# Patient Record
Sex: Female | Born: 1975 | Race: White | Hispanic: No | Marital: Married | Smoking: Never smoker
Health system: Southern US, Community
[De-identification: ages and names within clinical notes are randomized; demographics above are authoritative.]

## PROBLEM LIST (undated history)

## (undated) ENCOUNTER — Emergency Department (HOSPITAL_BASED_OUTPATIENT_CLINIC_OR_DEPARTMENT_OTHER): Payer: BC Managed Care – PPO | Source: Home / Self Care

## (undated) DIAGNOSIS — Z8759 Personal history of other complications of pregnancy, childbirth and the puerperium: Secondary | ICD-10-CM

## (undated) DIAGNOSIS — Z8659 Personal history of other mental and behavioral disorders: Secondary | ICD-10-CM

## (undated) DIAGNOSIS — D539 Nutritional anemia, unspecified: Secondary | ICD-10-CM

## (undated) DIAGNOSIS — R569 Unspecified convulsions: Secondary | ICD-10-CM

## (undated) DIAGNOSIS — G43909 Migraine, unspecified, not intractable, without status migrainosus: Secondary | ICD-10-CM

## (undated) DIAGNOSIS — E669 Obesity, unspecified: Secondary | ICD-10-CM

## (undated) HISTORY — DX: Unspecified convulsions: R56.9

## (undated) HISTORY — DX: Obesity, unspecified: E66.9

## (undated) HISTORY — DX: Personal history of other complications of pregnancy, childbirth and the puerperium: Z87.59

## (undated) HISTORY — DX: Nutritional anemia, unspecified: D53.9

## (undated) HISTORY — DX: Migraine, unspecified, not intractable, without status migrainosus: G43.909

## (undated) HISTORY — DX: Personal history of other mental and behavioral disorders: Z86.59

## (undated) HISTORY — PX: WISDOM TOOTH EXTRACTION: SHX21

---

## 2002-12-23 ENCOUNTER — Other Ambulatory Visit: Admission: RE | Admit: 2002-12-23 | Discharge: 2002-12-23 | Payer: Self-pay | Admitting: Obstetrics and Gynecology

## 2009-10-16 ENCOUNTER — Ambulatory Visit: Payer: Self-pay | Admitting: Family Medicine

## 2009-10-16 DIAGNOSIS — R635 Abnormal weight gain: Secondary | ICD-10-CM

## 2009-10-17 ENCOUNTER — Encounter: Payer: Self-pay | Admitting: Family Medicine

## 2009-10-18 LAB — CONVERTED CEMR LAB
Albumin: 3.7 g/dL (ref 3.5–5.2)
BUN: 12 mg/dL (ref 6–23)
Calcium: 8.8 mg/dL (ref 8.4–10.5)
Chloride: 104 meq/L (ref 96–112)
Glucose, Bld: 83 mg/dL (ref 70–99)
HDL: 44 mg/dL (ref >39)
Potassium: 4.4 meq/L (ref 3.5–5.3)
Sodium: 137 meq/L (ref 135–145)
TSH: 1.783 microintl units/mL (ref 0.350–4.500)
Total Protein: 7 g/dL (ref 6.0–8.3)
Triglycerides: 87 mg/dL (ref <150)

## 2009-11-14 ENCOUNTER — Encounter: Payer: Self-pay | Admitting: Family Medicine

## 2009-11-21 ENCOUNTER — Ambulatory Visit: Payer: Self-pay | Admitting: Family Medicine

## 2009-11-21 DIAGNOSIS — IMO0001 Reserved for inherently not codable concepts without codable children: Secondary | ICD-10-CM | POA: Insufficient documentation

## 2009-11-21 DIAGNOSIS — E669 Obesity, unspecified: Secondary | ICD-10-CM

## 2009-11-21 DIAGNOSIS — R030 Elevated blood-pressure reading, without diagnosis of hypertension: Secondary | ICD-10-CM

## 2009-12-21 ENCOUNTER — Ambulatory Visit: Payer: Self-pay | Admitting: Family Medicine

## 2010-05-01 NOTE — Assessment & Plan Note (Signed)
Summary: WT check   Vital Signs:  Patient profile:   35 year old female Menstrual status:  regular Height:      66.25 inches Weight:      214 pounds BMI:     34.40 O2 Sat:      99 % on Room air Pulse rate:   74 / minute BP sitting:   143 / 79  (left arm) Cuff size:   large  Vitals Entered By: Payton Spark CMA (November 21, 2009 9:12 AM)  O2 Flow:  Room air CC: F/U weight.    Primary Care Ray Glacken:  Seymour Bars DO  CC:  F/U weight. .  History of Present Illness: 35 yo WF presents for f/u weight managment.  She is exercising 3-4 days/ wk.  She is walking on the treadmill for 45 min each time.  She is walking fast and feels limited by her large breasts.  She is interested in possibly having breast reduction surgery.  She would like to lose more weight before proceeding with this.  She has cut out all sweet tea.  She eating out less often and doing a food diary.  She is feeling better since she is eating better.  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    Current Medications (verified): 1)  Seasonale 0.15-0.03 Mg Tabs (Levonorgest-Eth Estrad 91-Day) .... Take 1 Tab By Mouth Once Daily  Allergies (verified): No Known Drug Allergies  Past History:  Past Medical History: Reviewed history from 10/16/2009 and no changes required. G2P2 obesity  [facility]  Family History: Reviewed history from 10/16/2009 and no changes required. mother and father healthy 2 sisters healthy  Social History: Reviewed history from 10/16/2009 and no changes required. 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 Has 2 sons. Never smoked. Denies ETOH. No exercise, fair diet.  Review of Systems      See HPI  Physical Exam  General:  alert, well-developed, well-nourished, and well-hydrated.  obese Psych:  good eye contact, not anxious appearing, and not depressed appearing.     Impression & Recommendations:  Problem # 1:  ELEVATED BLOOD PRESSURE  WITHOUT DIAGNOSIS OF HYPERTENSION (ICD-796.2) BP checked x 2 (>140/90) but this is the first time it has been high. Recheck at nurse visit in 1 month.  Problem # 2:  OBESITY, UNSPECIFIED (ICD-278.00) BMI 34 c/w class I obesity (down from class II). Doing well.  Lost 11 lbs in  month with just diet/ exercise.  Reviewed her plan for continued wt loss thru lifestyle changes and her recent labs (normal fasting glucose, cholesterol, thyroid function with slight ALT elevation -- likely to be fatty liver).  Will RTC in 1 month to check back in for nurse visit WT/ BP check.  Complete Medication List: 1)  Seasonale 0.15-0.03 Mg Tabs (Levonorgest-eth estrad 91-day) .... Take 1 tab by mouth once daily  Patient Instructions: 1)  Repeat BP: 2)  Weight  Down 225--> 214  GREAT! 3)  Keep up the good work with food diary and avoiding sugar sweetenend drinks. 4)  Continue exercise: goal is 45-60 min 4-5 days/ wk. 5)  Return for  nurse visit WT/ BP check in 1 month.

## 2010-05-01 NOTE — Letter (Signed)
Summary: [facility]  [facility]   Imported By: Lanelle Bal 11/21/2009 12:55:50  _____________________________________________________________________  External Attachment:    Type:   Image     Comment:   External Document

## 2010-05-01 NOTE — Assessment & Plan Note (Signed)
Summary: nurse visit - BP/ WT check  Nurse Visit   Vital Signs:  Patient profile:   35 year old female Menstrual status:  regular Height:      66.25 inches Weight:      210 pounds Pulse rate:   76 / minute BP sitting:   118 / 69  (left arm) Cuff size:   large  Vitals Entered By: Payton Spark CMA (December 21, 2009 8:38 AM) CC: F/U weight and BP   Allergies: No Known Drug Allergies  Orders Added: 1)  Est. Patient Level I [16109]

## 2010-05-01 NOTE — Assessment & Plan Note (Signed)
Summary: NOV Weight gain   Vital Signs:  Patient profile:   35 year old female Menstrual status:  regular Height:      66.25 inches Weight:      225 pounds BMI:     36.17 O2 Sat:      98 % on Room air Pulse rate:   77 / minute BP sitting:   130 / 77  (left arm) Cuff size:   large  Vitals Entered By: Payton Spark CMA (October 16, 2009 10:26 AM)  O2 Flow:  Room air CC: New to est.      Menstrual Status regular   Primary Care Provider:  Seymour Bars DO  CC:  New to est. .  History of Present Illness: 35 yo WF presents to est care.  She sees [facility] for gyn care.  She is G2P2002.  She is otherwise healthy besides obesity.  She would like to lose weight.  She failed to lose her pregnancy wt after the 2nd child 3 yrs ago.  She is not exercising and admits to frequent intake of fast food due to time constraints.    She is due for fasting labs.  Current Medications (verified): 1)  Seasonale 0.15-0.03 Mg Tabs (Levonorgest-Eth Estrad 91-Day) .... 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 Never smoked. Denies ETOH. No exercise, fair diet.  Review of Systems       no fevers/sweats/weakness, unexplained wt loss/gain, no change in vision, no difficulty hearing, ringing in ears, no hay fever/+allergies, no CP/discomfort, no palpitations, no breast lump/nipple discharge, no cough/wheeze, no blood in stool, no N/V/D, no nocturia, no leaking urine, no unusual vag bleeding, no vaginal/penile discharge, no muscle/joint pain, no rash, no new/changing mole, no HA, no memory loss, no anxiety, no sleep problem, no depression, no unexplained lumps, no easy bruising/bleeding, no  concern with sexual function   Physical Exam  General:  obese WF in NAD Head:  normocephalic and atraumatic.   Mouth:  good dentition and pharynx pink and moist.   Neck:  no masses.   Lungs:  Normal respiratory effort, chest expands symmetrically. Lungs are clear to auscultation, no crackles or wheezes. Heart:  Normal rate and regular rhythm. S1 and S2 normal without gallop, murmur, click, rub or other extra sounds. Extremities:  no LE edema Skin:  color normal.   Psych:  good eye contact, not anxious appearing, and not depressed appearing.     Impression & Recommendations:  Problem # 1:  WEIGHT GAIN (ICD-783.1) Weight gain ever since the birth of her kids, starting 7 yrs ago with a goal wt of 150.  Will check TSH wit5h fasting labs and look for co morbidities such as dyslipidemia, IFG, fatty liver dz.  Counseled on the importance of weight loss thru healthy diet and regular exercise.  Wrote out some parameters for her to follow for the next month, then f/u with me in 1 month. Orders: T-TSH ([id number])  Complete Medication List: 1)  Seasonale 0.15-0.03 Mg Tabs (Levonorgest-eth estrad 91-day) .... Take 1 tab by mouth once daily  Other Orders: T-Comprehensive Metabolic Panel [phone]) T-Lipid Profile ([id number])  Patient Instructions: 1)  Work on healthy diet: 2)  Avoid  fast food.  Eat a high fiber breakfast each morning.  Try to eat more fruits and veggies, lean sources of protein and avoid sweet tea. 3)  Aim for getting 45+ min of walking in 5 days/ wk. 4)  Update fasting labs. 5)  Return for f/u Weight in 1 month to see how you are doing.

## 2010-11-14 ENCOUNTER — Inpatient Hospital Stay (INDEPENDENT_AMBULATORY_CARE_PROVIDER_SITE_OTHER)
Admission: RE | Admit: 2010-11-14 | Discharge: 2010-11-14 | Disposition: A | Discharge status: Home / Self Care | Payer: BC Managed Care – PPO | Source: Ambulatory Visit | Attending: Emergency Medicine | Admitting: Emergency Medicine

## 2010-11-14 ENCOUNTER — Encounter: Payer: Self-pay | Admitting: Emergency Medicine

## 2010-11-14 DIAGNOSIS — J209 Acute bronchitis, unspecified: Secondary | ICD-10-CM

## 2010-11-14 LAB — CONVERTED CEMR LAB: Rapid Strep: NEGATIVE

## 2010-12-27 ENCOUNTER — Encounter: Payer: Self-pay | Admitting: Emergency Medicine

## 2010-12-27 ENCOUNTER — Inpatient Hospital Stay (INDEPENDENT_AMBULATORY_CARE_PROVIDER_SITE_OTHER)
Admission: RE | Admit: 2010-12-27 | Discharge: 2010-12-27 | Disposition: A | Discharge status: Home / Self Care | Payer: BC Managed Care – PPO | Source: Ambulatory Visit | Attending: Emergency Medicine | Admitting: Emergency Medicine

## 2010-12-27 DIAGNOSIS — L2089 Other atopic dermatitis: Secondary | ICD-10-CM

## 2010-12-27 LAB — CONVERTED CEMR LAB: Rapid Strep: NEGATIVE

## 2010-12-29 ENCOUNTER — Inpatient Hospital Stay (INDEPENDENT_AMBULATORY_CARE_PROVIDER_SITE_OTHER)
Admission: RE | Admit: 2010-12-29 | Discharge: 2010-12-29 | Disposition: A | Discharge status: Home / Self Care | Payer: BC Managed Care – PPO | Source: Ambulatory Visit | Attending: Family Medicine | Admitting: Family Medicine

## 2010-12-29 ENCOUNTER — Encounter: Payer: Self-pay | Admitting: Family Medicine

## 2010-12-29 DIAGNOSIS — R21 Rash and other nonspecific skin eruption: Secondary | ICD-10-CM

## 2010-12-29 DIAGNOSIS — L2089 Other atopic dermatitis: Secondary | ICD-10-CM

## 2011-03-04 NOTE — Progress Notes (Signed)
Summary: SWOLLEN LYMPH NODES UNDER BOTH ARMS (room 2)   Vital Signs:  Patient Profile:   35 Years Old Female CC:      F/U from 9-27; lymph nodes bilat axilla swollen Height:     66.25 inches O2 Sat:      98 % O2 treatment:    Room Air Temp:     98.8 degrees F oral Pulse rate:   93 / minute Resp:     16 per minute BP sitting:   132 / 88  (left arm) Cuff size:   regular  Vitals Entered By: Lavell Islam RN (December 29, 2010 12:53 PM)                  Prior Medication List:  SEASONALE 0.15-0.03 MG TABS (LEVONORGEST-ETH ESTRAD 91-DAY) Take 1 tab by mouth once daily   Current Allergies (reviewed today): No known allergies History of Present Illness Chief Complaint: F/U from 9-27; lymph nodes bilat axilla swollen History of Present Illness:  Subjective:  Patient returns for follow-up of previously described rash.  She reports that lesions are still present on her upper chest, and resolving on her back.  She has developed increased tenderness in axillary nodes on both sides, worse on the right.  She feels well however.  No fevers, chills, and sweats.  Over the past week she has had some mild sinus congestion, but no significant sore throat.  No cough.  No GI or GU symptoms.  No tick bites.  She does remember having some mosquito bites recently.  Past history of mono.  She denies sores in mouth.  REVIEW OF SYSTEMS Constitutional Symptoms      Denies fever, chills, night sweats, weight loss, weight gain, and fatigue.  Eyes       Denies change in vision, eye pain, eye discharge, glasses, contact lenses, and eye surgery. Ear/Nose/Throat/Mouth       Denies hearing loss/aids, change in hearing, ear pain, ear discharge, dizziness, frequent runny nose, frequent nose bleeds, sinus problems, sore throat, hoarseness, and tooth pain or bleeding.  Respiratory       Denies dry cough, productive cough, wheezing, shortness of breath, asthma, bronchitis, and emphysema/COPD.  Cardiovascular      Denies murmurs, chest pain, and tires easily with exhertion.    Gastrointestinal       Denies stomach pain, nausea/vomiting, diarrhea, constipation, blood in bowel movements, and indigestion. Genitourniary       Denies painful urination, kidney stones, and loss of urinary control. Neurological       Denies paralysis, seizures, and fainting/blackouts. Musculoskeletal       Denies muscle pain, joint pain, joint stiffness, decreased range of motion, redness, swelling, muscle weakness, and gout.  Skin       Complains of unusual moles/lumps or sores.      Denies bruising and hair/skin or nail changes.  Psych       Denies mood changes, temper/anger issues, anxiety/stress, speech problems, depression, and sleep problems. Other Comments: bilateral axilla lymph nodes swollen/red   Past History:  Past Medical History: Reviewed history from 10/16/2009 and no changes required. G2P2 obesity  [facility]  Past Surgical History: Reviewed history from 11/14/2010 and no changes required. LTCS x 2 Caesarean section  Family History: Reviewed history from 10/16/2009 and no changes required. mother and father healthy 2 sisters healthy  Social History: Reviewed history from 10/16/2009 and no changes required. 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 Never smoked. Denies ETOH. No exercise, fair diet.   Objective:  Appearance:  Patient appears healthy, stated age, and in no acute distress  Eyes:  Pupils are equal, round, and reactive to light and accomodation.  Extraocular movement is intact.  Conjunctivae are not inflamed.  Ears:  Canals normal.  Tympanic membranes normal.   Nose:  Mildly congested turbinates.  No sinus tenderness  Pharynx:  Normal  Neck:  Supple.  No adenopathy is present.  No thyromegaly is present  Lungs:  Clear to auscultation.  Breath sounds are equal.  Heart:  Regular rate and rhythm without murmurs, rubs, or  gallops.  Abdomen:  Nontender without masses or hepatosplenomegaly.  Bowel sounds are present.  No CVA or flank tenderness.  Axillary areas:  Right reveals several shotty tender nodes.  Left reveals only 2 or 3 shotty nodes Skin:  On upper chest are numerous erythematous non-specific macules about 5 to 8mm dia.  Upper back reveals similar lesions in a resolving stage. CBC:  WBC 7.7 ; LY 22.1, MO 4.5, GR 73,4; Hgb 14.3  Assessment New Problems: EXANTHEM (ICD-782.1) DERMATITIS, ATOPIC (ICD-691.8)  SUSPECT VIRAL EXANTHEM, RESOLVING.  WITH AXILLARY ADENOPATHY, POSSIBLE WEST NILE VIRUS  Plan New Orders: CBC w/Diff [[id number]] Est. Patient Level IV [52841] Services provided After hours-Weekends-Holidays [99051] Planning Comments:   Chart reviewed (notes previous visit).  Since CBC is normal and patient is improving, recommend observation only at this time.  If symptoms persist or increase, recommend follow-up with dermatologist.   The patient and/or caregiver has been counseled thoroughly with regard to medications prescribed including dosage, schedule, interactions, rationale for use, and possible side effects and they verbalize understanding.  Diagnoses and expected course of recovery discussed and will return if not improved as expected or if the condition worsens. Patient and/or caregiver verbalized understanding.   Orders Added: 1)  CBC w/Diff [[id number]] 2)  Est. Patient Level IV [53664] 3)  Services provided After hours-Weekends-Holidays [99051]

## 2011-03-04 NOTE — Progress Notes (Signed)
Summary: extreme congestion/sore throat/thight chest (rm 2)   Vital Signs:  Patient Profile:   35 Years Old Female CC:      sore throat, congestion, cough, ear pain x 2 days Height:     66.25 inches Weight:      196 pounds O2 Sat:      98 % O2 treatment:    Room Air Temp:     99.5 degrees F oral Pulse rate:   86 / minute Resp:     18 per minute BP sitting:   118 / 82  (left arm) Cuff size:   large  Vitals Entered By: Lajean Saver RN (November 14, 2010 3:27 PM)                  Updated Prior Medication List: SEASONALE 0.15-0.03 MG TABS (LEVONORGEST-ETH ESTRAD 91-DAY) Take 1 tab by mouth once daily  Current Allergies: No known allergies History of Present Illness Chief Complaint: sore throat, congestion, cough, ear pain x 2 days History of Present Illness: Pt complains of 2 days of  sinus and chest congestion.  Mild  sore throat. + productive cough, No dyspnea. No chest pain. + rare wheezing.  No nausea No vomiting. + low grade fever, No chills No hx asthma or chronic lung dz   REVIEW OF SYSTEMS Constitutional Symptoms      Denies fever, chills, night sweats, weight loss, weight gain, and fatigue.  Eyes       Denies change in vision, eye pain, eye discharge, glasses, contact lenses, and eye surgery. Ear/Nose/Throat/Mouth       Complains of ear pain, frequent runny nose, sinus problems, and sore throat.      Denies hearing loss/aids, change in hearing, ear discharge, dizziness, frequent nose bleeds, hoarseness, and tooth pain or bleeding.  Respiratory       Complains of productive cough.      Denies dry cough, wheezing, shortness of breath, asthma, bronchitis, and emphysema/COPD.  Cardiovascular       Denies murmurs, chest pain, and tires easily with exhertion.    Gastrointestinal       Denies stomach pain, nausea/vomiting, diarrhea, constipation, blood in bowel movements, and indigestion. Genitourniary       Denies painful urination, blood or discharge from  vagina, kidney stones, and loss of urinary control. Neurological       Complains of headaches.      Denies paralysis, seizures, and fainting/blackouts. Musculoskeletal       Denies muscle pain, joint pain, joint stiffness, decreased range of motion, redness, swelling, muscle weakness, and gout.  Skin       Denies bruising, unusual mles/lumps or sores, and hair/skin or nail changes.  Psych       Denies mood changes, temper/anger issues, anxiety/stress, speech problems, depression, and sleep problems.  Past History:  Past Medical History: Reviewed history from 10/16/2009 and no changes required. G2P2 obesity  [facility]  Past Surgical History: LTCS x 2 Caesarean section  Family History: Reviewed history from 10/16/2009 and no changes required. mother and father healthy 2 sisters healthy  Social History: Reviewed history from 10/16/2009 and no changes required. 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 Never smoked. Denies ETOH. No exercise, fair diet. Physical Exam General appearance: well developed, well nourished, no acute distress Head: normocephalic, atraumatic Eyes: conjunctivae and lids normal Pupils: equal, round, reactive to light Ears: normal, no lesions or deformities Nasal: swollen red turbinates with congestion Oral/Pharynx:  mild pharyngeal erythema without exudate, uvula midline without deviation Neck: neck supple,  trachea midline, no masses Chest/Lungs: no rales bilateral, breath sounds equal without effort. Mild anterior rhonchi. Rare late exp wheeze heard once, cleared after coughing. Heart: regular rate and  rhythm, no murmur Extremities: normal extremities Neurological: grossly intact and non-focal Skin: no obvious rashes or lesions MSE: oriented to time, place, and person Assessment New Problems: ACUTE BRONCHITIS (ICD-466.0)   Patient Education: Patient and/or caregiver instructed in the  following: rest fluids and Tylenol.  Plan New Medications/Changes: PROMETHAZINE-CODEINE 6.25-10 MG/5ML SYRP (PROMETHAZINE-CODEINE) 1 or 2 teaspoons every 4-6 hrs as needed cough (may cause drowsiness)  #120 ml x 0, 11/14/2010, Lajean Manes MD ZITHROMAX Z-PAK 250 MG TABS (AZITHROMYCIN) take as directed  #1 pak x 0, 11/14/2010, Lajean Manes MD  New Orders: New Patient Level II [id number] Planning Comments:   We both agree that bronchodilator and/or prednisone not indicated at this point, but return here or pcp if worsening or not improving within 2-3 days .Risks, benefits, alternatives discussed. Pt voiced understanding and agreement.  Follow Up: Follow up with Primary Physician  The patient and/or caregiver has been counseled thoroughly with regard to medications prescribed including dosage, schedule, interactions, rationale for use, and possible side effects and they verbalize understanding.  Diagnoses and expected course of recovery discussed and will return if not improved as expected or if the condition worsens. Patient and/or caregiver verbalized understanding.  Prescriptions: PROMETHAZINE-CODEINE 6.25-10 MG/5ML SYRP (PROMETHAZINE-CODEINE) 1 or 2 teaspoons every 4-6 hrs as needed cough (may cause drowsiness)  #120 ml x 0   Entered and Authorized by:   Lajean Manes MD   Signed by:   Lajean Manes MD on 11/14/2010   Method used:   Printed then faxed to ...       [id number] (retail)       [address]       [place], [place]  [address]       Ph: [phone]       Fax: [phone]   RxID:   [id number] Christena Deem Z-PAK 250 MG TABS (AZITHROMYCIN) take as directed  #1 pak x 0   Entered and Authorized by:   Lajean Manes MD   Signed by:   Lajean Manes MD on 11/14/2010   Method used:   Electronically to        [id number] (retail)       [address]       [place], [place]  [address]       Ph: [phone]       Fax: [phone]   RxID:    [id number]   Orders Added: 1)  New Patient Level II [99202]    Laboratory Results  Date/Time Received: November 14, 2010 3:30 PM  Date/Time Reported: November 14, 2010 3:30 PM   Other Tests  Rapid Strep: negative  Kit Test Internal QC: Negative   (Normal Range: Negative)

## 2011-03-04 NOTE — Progress Notes (Signed)
Summary: Red Spots on Back and Chest   Vital Signs:  Patient Profile:   35 Years Old Female CC:      Red bumps on chest and back x last night Height:     66.25 inches Weight:      201 pounds O2 Sat:      99 % O2 treatment:    Room Air Temp:     98.8 degrees F oral Pulse rate:   81 / minute Resp:     14 per minute BP sitting:   131 / 85  (left arm) Cuff size:   large  Vitals Entered By: Lajean Saver RN (December 27, 2010 9:55 AM)                  Updated Prior Medication List: SEASONALE 0.15-0.03 MG TABS (LEVONORGEST-ETH ESTRAD 91-DAY) Take 1 tab by mouth once daily  Current Allergies: No known allergies History of Present Illness Chief Complaint: Red bumps on chest and back x last night History of Present Illness: Red bumps on chest and back since last night.  No new soaps, shampoos, detergents, perfumes, clothes.  She has a indoor cat & rabbit.  No recent yard work, travel.  No new meds.  They don't itch.  Located upper chest and upper back bilaterally.  Mild URI symptoms, otherwise feeling normal.  No sick contacts.  She had chicken pox when young.  REVIEW OF SYSTEMS Constitutional Symptoms      Denies fever, chills, night sweats, weight loss, weight gain, and fatigue.  Eyes       Denies change in vision, eye pain, eye discharge, glasses, contact lenses, and eye surgery. Ear/Nose/Throat/Mouth       Denies hearing loss/aids, change in hearing, ear pain, ear discharge, dizziness, frequent runny nose, frequent nose bleeds, sinus problems, sore throat, hoarseness, and tooth pain or bleeding.  Respiratory       Denies dry cough, productive cough, wheezing, shortness of breath, asthma, bronchitis, and emphysema/COPD.  Cardiovascular       Denies murmurs, chest pain, and tires easily with exhertion.    Gastrointestinal       Denies stomach pain, nausea/vomiting, diarrhea, constipation, blood in bowel movements, and indigestion. Genitourniary       Denies painful  urination, kidney stones, and loss of urinary control. Neurological       Denies paralysis, seizures, and fainting/blackouts. Musculoskeletal       Denies muscle pain, joint pain, joint stiffness, decreased range of motion, redness, swelling, muscle weakness, and gout.  Skin       Complains of unusual moles/lumps or sores.      Denies bruising and hair/skin or nail changes.      Comments: chest and back Psych       Denies mood changes, temper/anger issues, anxiety/stress, speech problems, depression, and sleep problems. Other Comments: Bumps developed last night. C/o ache in right axilla, no bump in axilla. No new meds or detergents or exposure to new allergy. Animals are indoors   Past History:  Past Medical History: Reviewed history from 10/16/2009 and no changes required. G2P2 obesity  [facility]  Past Surgical History: Reviewed history from 11/14/2010 and no changes required. LTCS x 2 Caesarean section  Family History: Reviewed history from 10/16/2009 and no changes required. mother and father healthy 2 sisters healthy Physical Exam General appearance: well developed, well nourished, no acute distress MSE: oriented to time, place, and person Multiple "insect bite"-looking lesions on bilateral upper chest & bilateral  upper back.  Each are about 1cm in size, not tender.  Rest of skin appears normal. Assessment New Problems: DERMATITIS, ATOPIC (ICD-691.8)   Plan New Orders: Rapid Strep [30865] Est. Patient Level II [78469] Planning Comments:   I am not sure what the cause of this rash is.  Doesn't look contact derm, doesn't look atopic. Looks like insect bites vs early hives, but she denies being bit by anything or contacting anything new.   Advised to check cat for fleas, sheets for bedbugs.  Can use topical cortisone and P.O benedryl.  Pt doesn't want steroids yet.  If contineus to worsen or spread, call her PCP or return here for futher evaluation/treatment.  Rapid  strep today was negative.    The patient and/or caregiver has been counseled thoroughly with regard to medications prescribed including dosage, schedule, interactions, rationale for use, and possible side effects and they verbalize understanding.  Diagnoses and expected course of recovery discussed and will return if not improved as expected or if the condition worsens. Patient and/or caregiver verbalized understanding.   Orders Added: 1)  Rapid Strep [62952] 2)  Est. Patient Level II [84132]    Laboratory Results  Date/Time Received: December 27, 2010 10:14 AM  Date/Time Reported: December 27, 2010 10:14 AM   Other Tests  Rapid Strep: negative  Kit Test Internal QC: Negative   (Normal Range: Negative)

## 2011-07-06 ENCOUNTER — Emergency Department
Admission: EM | Admit: 2011-07-06 | Discharge: 2011-07-06 | Disposition: A | Discharge status: Home / Self Care | Payer: BC Managed Care – PPO | Source: Home / Self Care | Attending: Family Medicine | Admitting: Family Medicine

## 2011-07-06 ENCOUNTER — Encounter: Payer: Self-pay | Admitting: *Deleted

## 2011-07-06 DIAGNOSIS — J209 Acute bronchitis, unspecified: Secondary | ICD-10-CM

## 2011-07-06 LAB — POCT RAPID STREP A (OFFICE): Rapid Strep A Screen: NEGATIVE

## 2011-07-06 MED ORDER — GUAIFENESIN-CODEINE 100-10 MG/5ML PO SYRP: 10.0000 mL | ORAL_SOLUTION | Freq: Every day | ORAL | Status: AC

## 2011-07-06 MED ORDER — CLARITHROMYCIN 500 MG PO TABS: 500.0000 mg | ORAL_TABLET | Freq: Two times a day (BID) | ORAL | Status: AC

## 2011-07-06 NOTE — ED Provider Notes (Signed)
History     CSN: [id number]  Arrival date & time 07/06/11  1008   First MD Initiated Contact with Patient 07/06/11 1034      Chief Complaint  Patient presents with  . Sore Throat  . Cough     HPI Comments: Patient complains of approximately 5 day history of gradually progressive URI symptoms beginning with  nasal congestion, followed by a mild sore throat (now improved).  A cough started about 3 days ago.  Complains of fatigue but no myalgias.  Cough is now worse at night and generally non-productive during the day.  She often coughs until she gags.   There has been no pleuritic pain, shortness of breath, or wheezes.  No fevers, chills, and sweats.  She does not remember her last tetanus shot.  The history is provided by the patient.    History reviewed. No pertinent past medical history.  Past Surgical History  Procedure Date  . Cesarean section     History reviewed. No pertinent family history.  History  Substance Use Topics  . Smoking status: Never Smoker   . Smokeless tobacco: Not on file  . Alcohol Use: No    OB History    Grav Para Term Preterm Abortions TAB SAB Ect Mult Living                  Review of Systems + sore throat + cough No pleuritic pain No wheezing + nasal congestion + post-nasal drainage No sinus pain/pressure No itchy/red eyes No earache No hemoptysis No SOB No fever/chills No nausea No vomiting No abdominal pain No diarrhea No urinary symptoms No skin rashes + fatigue No myalgias No headache Used OTC meds without relief  Allergies  Review of patient's allergies indicates not on file.  Home Medications   Current Outpatient Rx  Name Route Sig Dispense Refill  . LEVONORGEST-ETH ESTRAD 91-DAY 0.15-0.03 MG PO TABS Oral Take 1 tablet by mouth daily.    Marland Kitchen CLARITHROMYCIN 500 MG PO TABS Oral Take 1 tablet (500 mg total) by mouth 2 (two) times daily. 14 tablet 0  . GUAIFENESIN-CODEINE 100-10 MG/5ML PO SYRP Oral Take 10 mLs by mouth  at bedtime. for cough 120 mL 0    BP 134/94  Pulse 77  Temp(Src) 98.5 F (36.9 C) (Oral)  Resp 16  Ht 5\' 6"  (1.676 m)  Wt 209 lb 8 oz (95.029 kg)  BMI 33.81 kg/m2  SpO2 96%  Physical Exam Nursing notes and Vital Signs reviewed. Appearance:  Patient appears healthy, stated age, and in no acute distress Eyes:  Pupils are equal, round, and reactive to light and accomodation.  Extraocular movement is intact.  Conjunctivae are not inflamed  Ears:  Canals normal.  Tympanic membranes normal.  Nose:  Mildly congested turbinates.  No sinus tenderness.  Pharynx:  Normal Neck:  Supple.   Tender shotty posterior nodes are palpated bilaterally  Lungs:  Clear to auscultation.  Breath sounds are equal.  Chest:  Distinct tenderness to palpation over the mid-sternum.  Heart:  Regular rate and rhythm without murmurs, rubs, or gallops.  Abdomen:  Nontender without masses or hepatosplenomegaly.  Bowel sounds are present.  No CVA or flank tenderness.  Extremities:  No edema.  No calf tenderness Skin:  No rash present.   ED Course  Procedures none   Labs Reviewed  POCT RAPID STREP A (OFFICE) negative      1. Acute bronchitis       MDM   ?  Pertussis Begin Biaxin.  Robitussin AC at bedtime. Take Mucinex D (guaifenesin with decongestant) twice daily for congestion.  Increase fluid intake, rest. May use Afrin nasal spray (or generic oxymetazoline) twice daily for about 5 days.  Also recommend using saline nasal spray several times daily and saline nasal irrigation (AYR is a common brand) Stop all antihistamines for now, and other non-prescription cough/cold preparations. May take Ibuprofen 200mg , 4 tabs every 8 hours with food for chest/sternum discomfort. Recommend a Tdap when well.  Follow-up with family doctor if not improving 7 to 10 days.         Lattie Haw, MD 07/06/11 1100

## 2011-07-06 NOTE — Discharge Instructions (Signed)
Take Mucinex D (guaifenesin with decongestant) twice daily for congestion.  Increase fluid intake, rest. May use Afrin nasal spray (or generic oxymetazoline) twice daily for about 5 days.  Also recommend using saline nasal spray several times daily and saline nasal irrigation (AYR is a common brand) Stop all antihistamines for now, and other non-prescription cough/cold preparations. May take Ibuprofen 200mg, 4 tabs every 8 hours with food for chest/sternum discomfort. Recommend a Tdap when well.  Follow-up with family doctor if not improving 7 to 10 days.  

## 2011-07-06 NOTE — ED Notes (Signed)
Pt complains of sore throat and productive cough with yellow mucous x 2 days.  Chest pain associated with cough and feeling fatigued.

## 2011-07-08 ENCOUNTER — Telehealth: Payer: Self-pay | Admitting: Family Medicine

## 2011-09-09 ENCOUNTER — Encounter: Payer: Self-pay | Admitting: Family Medicine

## 2011-09-09 ENCOUNTER — Ambulatory Visit (INDEPENDENT_AMBULATORY_CARE_PROVIDER_SITE_OTHER): Payer: BC Managed Care – PPO | Admitting: Family Medicine

## 2011-09-09 VITALS — BP 149/87 | HR 77 | Temp 99.0°F | Wt 272.0 lb

## 2011-09-09 DIAGNOSIS — G43109 Migraine with aura, not intractable, without status migrainosus: Secondary | ICD-10-CM

## 2011-09-09 DIAGNOSIS — R030 Elevated blood-pressure reading, without diagnosis of hypertension: Secondary | ICD-10-CM

## 2011-09-09 DIAGNOSIS — R569 Unspecified convulsions: Secondary | ICD-10-CM

## 2011-09-09 NOTE — Patient Instructions (Signed)

## 2011-09-09 NOTE — Progress Notes (Signed)
Subjective:    Patient ID: Danielle Horn, female    DOB: 10/13/1975, 36 y.o.   MRN: [id number]  HPI 1st occurance happened memorial weekend, wet bed, bit tongue per husband looked like having seizure, 2nd time last wed,same thing happend, no fam hx of seizures. Woke up afterwards adn noticed blood on her tongue.  Husband recently lost her job and has been under a lot of stress. Says her tongue is still sore from the last time she bit it.  Next day on Thrusday felt sore all over a her body and tired.  Normally no problems with sleep quality, etc.  No family history of seizure disorder. No new meds.  No head trauma. No vision changes, hearing loss or smell changes.  No weakness or numbness in hands or arm. Hx of migraines. Usually worse near her periods. Seasonale helps. Hx of aura.  Hx of anemia during pregnancy. I asked her she hasn't experienced any other odd symptoms in the last month or any type of changes to her body in the last month. She denies any fatigue, respiratory, GI problems, dizziness et Karie Soda.    Review of Systems BP 149/87  Pulse 77  Temp(Src) 99 F (37.2 C) (Oral)  Wt 272 lb (123.378 kg)  SpO2 99%    No Known Allergies  No past medical history on file.  Past Surgical History  Procedure Date  . Cesarean section     History   Social History  . Marital Status: Married    Spouse Name: N/A    Number of Children: N/A  . Years of Education: N/A   Occupational History  . Not on file.   Social History Main Topics  . Smoking status: Never Smoker   . Smokeless tobacco: Not on file  . Alcohol Use: No  . Drug Use: No  . Sexually Active:    Other Topics Concern  . Not on file   Social History Narrative  . No narrative on file    No family history on file.  Outpatient Encounter Prescriptions as of 09/09/2011  Medication Sig Dispense Refill  . levonorgestrel-ethinyl estradiol (SEASONALE,INTROVALE,JOLESSA) 0.15-0.03 MG tablet Take 1 tablet by mouth daily.               Objective:   Physical Exam  Constitutional: She is oriented to person, place, and time. She appears well-developed and well-nourished.  HENT:  Head: Normocephalic and atraumatic.  Right Ear: External ear normal.  Left Ear: External ear normal.  Nose: Nose normal.  Mouth/Throat: Oropharynx is clear and moist.       TMs and canals are clear.   Eyes: Conjunctivae and EOM are normal. Pupils are equal, round, and reactive to light.  Neck: Neck supple. No thyromegaly present.  Cardiovascular: Normal rate, regular rhythm and normal heart sounds.   Pulmonary/Chest: Effort normal and breath sounds normal. She has no wheezes.  Lymphadenopathy:    She has no cervical adenopathy.  Neurological: She is alert and oriented to person, place, and time.       Alert and oriented.  CN 2-12 intact.  Neg rhomberg. Normal rapid alternating movements of hands. Reflexes symmetric in the UE and LE.  Normal knee to ankle, down the shin bilaterally.  No tremor.     Skin: Skin is warm and dry.  Psychiatric: She has a normal mood and affect.          Assessment & Plan:  Seizure like activity - will check labs to rule  out anemia, thyroid disorder and electrolyte disturbances. Also rule out abnormal calcium levels. I will refer her to neurology for further evaluation workup. She'll most likely need an EEG. I will schedule for MRI without contrast as well of the brain. Will check prolactin but will likely be normal since last episdoes was 5 days ago.    Elevated BP - recheck in 1-2 weeks. She says her blood pressure has been up and down since then her last pregnancy. I encouraged her to eat a low-salt diet and will recheck in 1-2 weeks. If elevated at that point in time in we will need to start her blood pressure medication. Patient says she's in agreement and will followup.  Migraine with aura-explained her that with a history of migraine with aura birth control pills are actually contraindicated  increase her risk of stroke. She does not have an or with each migraine that even a positive history is a significant risk factor. We can discuss this further at her next followup. Likely she will need to come off the oral contraceptives.

## 2011-09-10 LAB — COMPLETE METABOLIC PANEL WITH GFR
ALT: 22 U/L (ref 0–35)
AST: 19 U/L (ref 0–37)
Albumin: 4.1 g/dL (ref 3.5–5.2)
Alkaline Phosphatase: 56 U/L (ref 39–117)
GFR, Est Non African American: >89 mL/min
Glucose, Bld: 86 mg/dL (ref 70–99)
Potassium: 5 mEq/L (ref 3.5–5.3)
Sodium: 140 mEq/L (ref 135–145)
Total Bilirubin: 1.2 mg/dL (ref 0.3–1.2)
Total Protein: 7.3 g/dL (ref 6.0–8.3)

## 2011-09-10 LAB — PROLACTIN: Prolactin: 8 ng/mL

## 2011-09-10 LAB — TSH: TSH: 1.535 u[IU]/mL (ref 0.350–4.500)

## 2011-09-10 LAB — CBC WITH DIFFERENTIAL/PLATELET
Basophils Absolute: 0.1 10*3/uL (ref 0.0–0.1)
Basophils Relative: 1 % (ref 0–1)
Eosinophils Absolute: 0.3 10*3/uL (ref 0.0–0.7)
Hemoglobin: 13.8 g/dL (ref 12.0–15.0)
MCH: 29 pg (ref 26.0–34.0)
MCHC: 32.9 g/dL (ref 30.0–36.0)
Monocytes Relative: 7 % (ref 3–12)
Neutro Abs: 5.1 10*3/uL (ref 1.7–7.7)
Neutrophils Relative %: 52 % (ref 43–77)
Platelets: 506 10*3/uL — ABNORMAL HIGH (ref 150–400)

## 2011-09-13 ENCOUNTER — Ambulatory Visit
Admission: RE | Admit: 2011-09-13 | Discharge: 2011-09-13 | Disposition: A | Discharge status: Home / Self Care | Payer: BC Managed Care – PPO | Source: Ambulatory Visit | Attending: Family Medicine | Admitting: Family Medicine

## 2011-09-13 DIAGNOSIS — R569 Unspecified convulsions: Secondary | ICD-10-CM

## 2011-10-17 ENCOUNTER — Telehealth: Payer: Self-pay | Admitting: Family Medicine

## 2011-10-17 ENCOUNTER — Encounter: Payer: Self-pay | Admitting: Family Medicine

## 2011-10-17 ENCOUNTER — Ambulatory Visit (INDEPENDENT_AMBULATORY_CARE_PROVIDER_SITE_OTHER): Payer: BC Managed Care – PPO | Admitting: Family Medicine

## 2011-10-17 VITALS — BP 146/87 | HR 89 | Wt 205.0 lb

## 2011-10-17 DIAGNOSIS — D473 Essential (hemorrhagic) thrombocythemia: Secondary | ICD-10-CM

## 2011-10-17 DIAGNOSIS — R7989 Other specified abnormal findings of blood chemistry: Secondary | ICD-10-CM

## 2011-10-17 DIAGNOSIS — R030 Elevated blood-pressure reading, without diagnosis of hypertension: Secondary | ICD-10-CM

## 2011-10-17 DIAGNOSIS — R894 Abnormal immunological findings in specimens from other organs, systems and tissues: Secondary | ICD-10-CM

## 2011-10-17 DIAGNOSIS — R768 Other specified abnormal immunological findings in serum: Secondary | ICD-10-CM

## 2011-10-17 DIAGNOSIS — G40909 Epilepsy, unspecified, not intractable, without status epilepticus: Secondary | ICD-10-CM

## 2011-10-17 NOTE — Progress Notes (Signed)
  Subjective:    Patient ID: Danielle Horn, female    DOB: [birth year], 36 y.o.   MRN: [id number]  HPI No more seizure activitiy. Saw Neurology for this. They recommend MRI of brain in 3 months.  They also did some bloodwork there and is here to follow up on this.  Had a normal EEG as well. Was told ANA was +.  Started on topamax and doing well so far. F/U in September. She denies any achy painful or swollen joints that have lasted more than 3 months. She denies any color change in her skin over her fingers or toes. She denies any sores that last longer than a week that do not heal in her mouth. She denies any prior history of anemia or low platelet count. She denies any facial rash is better in the shape of a butterfly. She denies any unexplained fevers. She denies any sensitivity to the sun. She denies any significant chest pain or shortness of breath. She denies any lower extremity edema. She denies any extreme her persistent fatigue. No prior history of blood clots or heart attack. No sudden or unexplained hair loss.   Review of Systems     Objective:   Physical Exam  Constitutional: She is oriented to person, place, and time. She appears well-developed and well-nourished.  HENT:  Head: Normocephalic and atraumatic.  Cardiovascular: Normal rate, regular rhythm and normal heart sounds.   Pulmonary/Chest: Effort normal and breath sounds normal.  Musculoskeletal:       No joint swelling or redness of the hands.  Neurological: She is alert and oriented to person, place, and time.  Skin: Skin is warm and dry.  Psychiatric: She has a normal mood and affect. Her behavior is normal.          Assessment & Plan:  + ANA-  I think this is unlikely to be significant. She has no significant symptoms whatsoever for lupus. Her only risk factor would be having a new onset of seizures. At this point I do not feel that she needs any additional workup. I did encourage her to monitor for any of these  symptoms and if she starts to notice them to please followup and make an appointment. If that occurs then will likely refer her to rheumatology for further evaluation. She says she has a family member with lupus and says she feels somewhat familiar with some of the signs and symptoms.  Seizure disorder-so far she's doing on Topamax as a followup in September [facility] And the Topamax is increased choices that will help with her migraines as well.  Elevated BP - like her to followup in one to 2 weeks for repeat blood pressure check. She has had elevated pressures in the past.  High platelet count-on the blood work that we did her platelet count was 500. I would like to repeat that today. It certainly could have been elevated from acute phase reactant from her seizure. I would like to confirm that has returned to normal.

## 2011-10-17 NOTE — Telephone Encounter (Signed)
Call patient: Her blood pressure was elevated here today in the office. I would like her to come back in a couple weeks for nurse visit to recheck it. I know she has had sometimes where it was elevated in the past.

## 2011-10-18 LAB — CBC WITH DIFFERENTIAL/PLATELET
Eosinophils Relative: 4 % (ref 0–5)
HCT: 41.7 % (ref 36.0–46.0)
Hemoglobin: 13.6 g/dL (ref 12.0–15.0)
Lymphocytes Relative: 36 % (ref 12–46)
MCV: 88 fL (ref 78.0–100.0)
Monocytes Absolute: 0.5 10*3/uL (ref 0.1–1.0)
Monocytes Relative: 7 % (ref 3–12)
Neutro Abs: 3.9 10*3/uL (ref 1.7–7.7)
WBC: 7.5 10*3/uL (ref 4.0–10.5)

## 2011-10-18 NOTE — Telephone Encounter (Signed)
Left message on voicemail.

## 2011-10-21 ENCOUNTER — Other Ambulatory Visit: Payer: Self-pay | Admitting: Family Medicine

## 2011-10-21 DIAGNOSIS — D473 Essential (hemorrhagic) thrombocythemia: Secondary | ICD-10-CM

## 2011-10-23 LAB — CBC WITH DIFFERENTIAL/PLATELET
Basophils Relative: 1 % (ref 0–1)
Eosinophils Absolute: 0.4 10*3/uL (ref 0.0–0.7)
Eosinophils Relative: 5 % (ref 0–5)
Hemoglobin: 13.3 g/dL (ref 12.0–15.0)
MCH: 28.2 pg (ref 26.0–34.0)
MCHC: 32.8 g/dL (ref 30.0–36.0)
MCV: 86 fL (ref 78.0–100.0)
Monocytes Relative: 6 % (ref 3–12)
Neutrophils Relative %: 51 % (ref 43–77)
Platelets: 479 10*3/uL — ABNORMAL HIGH (ref 150–400)

## 2011-10-23 LAB — HIGH SENSITIVITY CRP: CRP, High Sensitivity: 15.7 mg/L — ABNORMAL HIGH

## 2011-10-24 LAB — PATHOLOGIST SMEAR REVIEW

## 2011-10-24 LAB — FIBRINOGEN: Fibrinogen: 375 mg/dL (ref 204–475)

## 2011-11-05 ENCOUNTER — Ambulatory Visit (INDEPENDENT_AMBULATORY_CARE_PROVIDER_SITE_OTHER): Payer: BC Managed Care – PPO | Admitting: Family Medicine

## 2011-11-05 VITALS — BP 124/86 | HR 78

## 2011-11-05 DIAGNOSIS — I10 Essential (primary) hypertension: Secondary | ICD-10-CM

## 2011-11-05 MED ORDER — LISINOPRIL 10 MG PO TABS: 10.0000 mg | ORAL_TABLET | Freq: Every day | ORAL | Status: DC

## 2011-11-05 NOTE — Progress Notes (Signed)
Pt states her Neurologist  Told her not to take any BP meds stating that she was possibly just nervous about being there. Pt also states that she is nervous about taking the BP med and states she will make an appt to see you before she starts any BP med.

## 2011-11-05 NOTE — Progress Notes (Addendum)
  Subjective:    Patient ID: Danielle Horn, female    DOB: [birth year], 36 y.o.   MRN: [id number] Pt denies CP, SOB, dizziness, or heart palpitations. taking meds as directed without problems. Denies med side effects. 5 min spent with pt.  HPI    Review of Systems     Objective:   Physical Exam        Assessment & Plan:  HTN - BP is still high but slighly better. Will start her on BP medication in addition to low sat diet. THen f/u with me in 3 weeks.   Nani Gasser, MD

## 2011-11-08 ENCOUNTER — Encounter: Payer: Self-pay | Admitting: Family Medicine

## 2011-11-08 ENCOUNTER — Ambulatory Visit (INDEPENDENT_AMBULATORY_CARE_PROVIDER_SITE_OTHER): Payer: BC Managed Care – PPO | Admitting: Family Medicine

## 2011-11-08 VITALS — BP 123/84 | HR 65 | Wt 201.0 lb

## 2011-11-08 DIAGNOSIS — R790 Abnormal level of blood mineral: Secondary | ICD-10-CM

## 2011-11-08 DIAGNOSIS — R030 Elevated blood-pressure reading, without diagnosis of hypertension: Secondary | ICD-10-CM

## 2011-11-08 DIAGNOSIS — R799 Abnormal finding of blood chemistry, unspecified: Secondary | ICD-10-CM

## 2011-11-08 DIAGNOSIS — D473 Essential (hemorrhagic) thrombocythemia: Secondary | ICD-10-CM

## 2011-11-08 DIAGNOSIS — Z23 Encounter for immunization: Secondary | ICD-10-CM

## 2011-11-08 NOTE — Patient Instructions (Addendum)
Call in med September and we will recheck platelets and the ferritin (iron) levels.

## 2011-11-08 NOTE — Progress Notes (Signed)
  Subjective:    Patient ID: Danielle Horn, female    DOB: June 27, 1975, 36 y.o.   MRN: [id number]  HPI BPs have been running 118/80 at home.   Hasnt' been taking the BP med.  She feels she was really anxious that day.  Does have family hx of HTN.     Review of Systems     Objective:   Physical Exam  Constitutional: She appears well-developed and well-nourished.  HENT:  Head: Normocephalic and atraumatic.  Skin: Skin is warm and dry.  Psychiatric: She has a normal mood and affect. Her behavior is normal.          Assessment & Plan:  Elevated BP - BP has been well controlled at home off the medication. Will continue to monitor and remove med from list. WE discussed things that can elevated BP adn to keep an eye on this  Tdap given today.   Iron deficiency anemia-her ferritin was low. She's now taking an over-the-counter supplement. Recheck in the middle of September.  Thrombocytosis-her platelets have been trending down. They appear to be because of an acute phase reaction. His CRP was elevated and peripheral smear showed reactive thrombocytosis. Recheck again in the middle of September.

## 2011-12-09 ENCOUNTER — Other Ambulatory Visit: Payer: Self-pay

## 2011-12-09 DIAGNOSIS — R930 Abnormal findings on diagnostic imaging of skull and head, not elsewhere classified: Secondary | ICD-10-CM

## 2011-12-11 ENCOUNTER — Other Ambulatory Visit: Payer: BC Managed Care – PPO

## 2011-12-16 ENCOUNTER — Ambulatory Visit
Admission: RE | Admit: 2011-12-16 | Discharge: 2011-12-16 | Disposition: A | Discharge status: Home / Self Care | Payer: BC Managed Care – PPO | Source: Ambulatory Visit

## 2011-12-16 DIAGNOSIS — R930 Abnormal findings on diagnostic imaging of skull and head, not elsewhere classified: Secondary | ICD-10-CM

## 2011-12-16 MED ORDER — GADOBENATE DIMEGLUMINE 529 MG/ML IV SOLN
19.0000 mL | Freq: Once | INTRAVENOUS | Status: AC | PRN
  Administered 2011-12-16: 19 mL via INTRAVENOUS

## 2011-12-27 ENCOUNTER — Encounter: Payer: Self-pay | Admitting: Family Medicine

## 2011-12-27 ENCOUNTER — Ambulatory Visit (INDEPENDENT_AMBULATORY_CARE_PROVIDER_SITE_OTHER): Payer: BC Managed Care – PPO | Admitting: Family Medicine

## 2011-12-27 VITALS — BP 144/91 | HR 87 | Temp 98.5°F | Wt 201.0 lb

## 2011-12-27 DIAGNOSIS — J40 Bronchitis, not specified as acute or chronic: Secondary | ICD-10-CM

## 2011-12-27 MED ORDER — AMOXICILLIN-POT CLAVULANATE 875-125 MG PO TABS: 1.0000 | ORAL_TABLET | Freq: Two times a day (BID) | ORAL | Status: DC

## 2011-12-27 NOTE — Patient Instructions (Signed)

## 2011-12-27 NOTE — Progress Notes (Signed)
  Subjective:    Patient ID: Danielle Horn, female    DOB: Jul 31, 1975, 36 y.o.   MRN: [id number]  HPI Mild congestion and cold starting a week ago. Thought initally may be allergies but not taking any allergy meds.  Then cough started yesterdays. No fever. Some sputum.   Still some congestion. Says chest hurts with cough. Mild ST.  Some ear pressure. Head feels heavy. No meds last 2 days.  When first started Sudafed but not really helpful.     Review of Systems     Objective:   Physical Exam  Constitutional: She is oriented to person, place, and time. She appears well-developed and well-nourished.  HENT:  Head: Normocephalic and atraumatic.  Right Ear: External ear normal.  Left Ear: External ear normal.  Nose: Nose normal.  Mouth/Throat: Oropharynx is clear and moist.       TMs and canals are clear. Mild cobblestoning of the throat  Eyes: Conjunctivae normal and EOM are normal. Pupils are equal, round, and reactive to light.  Neck: Neck supple. No thyromegaly present.  Cardiovascular: Normal rate, regular rhythm and normal heart sounds.   Pulmonary/Chest: Effort normal and breath sounds normal. She has no wheezes.  Lymphadenopathy:    She has no cervical adenopathy.  Neurological: She is alert and oriented to person, place, and time.  Skin: Skin is warm and dry.  Psychiatric: She has a normal mood and affect.          Assessment & Plan:  Bronchitis-I think at this point Ms. likely viral. Dr. sinus symptoms actually seem to be improving mildly. I did go ahead and give her prescription since we are heading into the weekend for Augmentin. If she suddenly feels worse or if after the weekend she's just not better then she can go ahead and fill the prescription. Recommend continue over-the-counter symptomatic care for the cough. She declined a prescription nighttime cough medicine. If she does need to fill the prescription is still not getting better then please call the office back.

## 2011-12-31 ENCOUNTER — Ambulatory Visit (INDEPENDENT_AMBULATORY_CARE_PROVIDER_SITE_OTHER): Payer: BC Managed Care – PPO | Admitting: Family Medicine

## 2011-12-31 ENCOUNTER — Encounter: Payer: Self-pay | Admitting: Family Medicine

## 2011-12-31 VITALS — BP 142/91 | HR 80 | Wt 199.0 lb

## 2011-12-31 DIAGNOSIS — Z23 Encounter for immunization: Secondary | ICD-10-CM

## 2011-12-31 DIAGNOSIS — R930 Abnormal findings on diagnostic imaging of skull and head, not elsewhere classified: Secondary | ICD-10-CM

## 2011-12-31 DIAGNOSIS — R569 Unspecified convulsions: Secondary | ICD-10-CM

## 2011-12-31 DIAGNOSIS — R9089 Other abnormal findings on diagnostic imaging of central nervous system: Secondary | ICD-10-CM

## 2011-12-31 DIAGNOSIS — Z298 Encounter for other specified prophylactic measures: Secondary | ICD-10-CM

## 2011-12-31 NOTE — Progress Notes (Signed)
  Subjective:    Patient ID: Danielle Horn, female    DOB: Mar 14, 1976, 36 y.o.   MRN: [id number]  HPI  Saw Neurology yesterday to f/u her MRI.  Spot on brain was stable.  He recommend to have another MRI in 6 months or seeing Neurosurgeon for possible biopsy. She is at the point she would like a second opinion.  He has not had any more seizure activity which is fantastic. I did see her a few days ago for bronchitis and she feels like she is slowly getting better.  Review of Systems  BP 142/91  Pulse 80  Wt 199 lb (90.266 kg)    No Known Allergies  No past medical history on file.  Past Surgical History  Procedure Date  . Cesarean section     History   Social History  . Marital Status: Married    Spouse Name: N/A    Number of Children: N/A  . Years of Education: N/A   Occupational History  . Not on file.   Social History Main Topics  . Smoking status: Never Smoker   . Smokeless tobacco: Not on file  . Alcohol Use: No  . Drug Use: No  . Sexually Active:    Other Topics Concern  . Not on file   Social History Narrative  . No narrative on file    No family history on file.  Outpatient Encounter Prescriptions as of 12/31/2011  Medication Sig Dispense Refill  . levonorgestrel-ethinyl estradiol (SEASONALE,INTROVALE,JOLESSA) 0.15-0.03 MG tablet Take 1 tablet by mouth daily.      Marland Kitchen topiramate (TOPAMAX) 50 MG tablet Take 50 mg by mouth 2 (two) times daily.      Marland Kitchen DISCONTD: amoxicillin-clavulanate (AUGMENTIN) 875-125 MG per tablet Take 1 tablet by mouth 2 (two) times daily.  20 tablet  0          Objective:   Physical Exam  Constitutional: She is oriented to person, place, and time. She appears well-developed and well-nourished.  HENT:  Head: Normocephalic and atraumatic.  Neurological: She is alert and oriented to person, place, and time.  Skin: Skin is warm and dry.  Psychiatric: She has a normal mood and affect. Her behavior is normal.            Assessment & Plan:  Brain lesion on MRI -will refer for second opinion. I think this is prudent and I think it will hopefully give her another opinion about whether her MRI just needs to be repeated whether not she may actually need a biopsy. 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 And this certainly could be a cause of seizures. Fortunately she has not had any more seizures since July.  Flu vaccine given.

## 2012-02-13 ENCOUNTER — Ambulatory Visit (INDEPENDENT_AMBULATORY_CARE_PROVIDER_SITE_OTHER): Payer: BC Managed Care – PPO | Admitting: Family Medicine

## 2012-02-13 ENCOUNTER — Encounter: Payer: Self-pay | Admitting: Family Medicine

## 2012-02-13 VITALS — BP 106/82 | HR 98 | Temp 97.6°F | Wt 193.0 lb

## 2012-02-13 DIAGNOSIS — B9689 Other specified bacterial agents as the cause of diseases classified elsewhere: Secondary | ICD-10-CM

## 2012-02-13 DIAGNOSIS — A499 Bacterial infection, unspecified: Secondary | ICD-10-CM

## 2012-02-13 DIAGNOSIS — J329 Chronic sinusitis, unspecified: Secondary | ICD-10-CM

## 2012-02-13 MED ORDER — AMOXICILLIN-POT CLAVULANATE 875-125 MG PO TABS: 1.0000 | ORAL_TABLET | Freq: Two times a day (BID) | ORAL | Status: DC

## 2012-02-13 NOTE — Progress Notes (Signed)
CC: Danielle Horn is a 36 y.o. female is here for Cough and Nasal Congestion   Subjective: HPI:  Patient reports facial pain, nasal discharge, and a cough(worse when lying down flat) that can present since Sunday night. Associated with chills and night sweats but no fevers. Seems to be getting worse in a daily basis. Since onset has been associated with fatigue and trouble sleeping due to the above symptoms. She denies shortness of breath, blood in her sputum, wheezing, chest pain, motor or sensory disturbances, hearing loss, ear pain, dysphagia. Has been using Mucinex D and ibuprofen without improvement of symptoms   Review Of Systems Outlined In HPI  No past medical history on file.   No family history on file.   History  Substance Use Topics  . Smoking status: Never Smoker   . Smokeless tobacco: Not on file  . Alcohol Use: No     Objective: Filed Vitals:   02/13/12 0949  BP: 106/82  Pulse: 98  Temp: 97.6 F (36.4 C)    General: Alert and Oriented, No Acute Distress HEENT: Pupils equal, round, reactive to light. Conjunctivae clear.  External ears unremarkable, canals clear with intact TMs with appropriate landmarks.  Middle ear appears open without effusion. Pink inferior turbinates with scant dried blood on left anterior turn at.  Moist mucous membranes, pharynx without inflammation nor lesions.  Neck supple without palpable lymphadenopathy nor abnormal masses. Frontal sinus tenderness to percussion left more than right, left maxillary sinus tenderness with percussion Lungs: Clear to auscultation bilaterally, no wheezing/ronchi/rales.  Comfortable work of breathing. Good air movement. Cardiac: Regular rate and rhythm. Normal S1/S2.  No murmurs, rubs, nor gallops.     Assessment & Plan: Danielle Horn was seen today for cough and nasal congestion.  Diagnoses and associated orders for this visit:  Bacterial sinusitis - amoxicillin-clavulanate (AUGMENTIN) 875-125 MG per tablet; Take  1 tablet by mouth every 12 (twelve) hours.    Continue Mucinex D, fluids, scheduled ibuprofen. Start Augmentin. Add nasal saline washes to help with facial pressure and postnasal drip.Signs and symptoms requring emergent/urgent reevaluation were discussed with the patient.  Return if symptoms worsen or fail to improve.

## 2012-02-14 ENCOUNTER — Telehealth: Payer: Self-pay | Admitting: *Deleted

## 2012-02-14 MED ORDER — DOXYCYCLINE HYCLATE 100 MG PO TABS: ORAL_TABLET | ORAL | Status: AC

## 2012-02-14 NOTE — Telephone Encounter (Signed)
Pt.notified

## 2012-02-14 NOTE — Telephone Encounter (Signed)
Thanks Selena Batten, I've sent in doxycycline, will you please let her know that I'm sorry and that doxycyline is a completely different class of antibiotics but should still be effective.

## 2012-02-14 NOTE — Telephone Encounter (Signed)
Pt seen yesterday and put on Augmentin took first dose yesterday evening- has been vomiting since then and woke up this morning with rash all over her. ? Needs different med. I will add this to her allergy list

## 2012-02-20 ENCOUNTER — Ambulatory Visit: Payer: BC Managed Care – PPO | Admitting: Family Medicine

## 2012-09-30 ENCOUNTER — Encounter: Payer: Self-pay | Admitting: Family Medicine

## 2012-09-30 ENCOUNTER — Ambulatory Visit (INDEPENDENT_AMBULATORY_CARE_PROVIDER_SITE_OTHER): Payer: BC Managed Care – PPO | Admitting: Family Medicine

## 2012-09-30 VITALS — BP 130/80 | HR 80 | Ht 66.0 in | Wt 209.0 lb

## 2012-09-30 DIAGNOSIS — R930 Abnormal findings on diagnostic imaging of skull and head, not elsewhere classified: Secondary | ICD-10-CM

## 2012-09-30 DIAGNOSIS — R9089 Other abnormal findings on diagnostic imaging of central nervous system: Secondary | ICD-10-CM

## 2012-09-30 DIAGNOSIS — D509 Iron deficiency anemia, unspecified: Secondary | ICD-10-CM

## 2012-09-30 DIAGNOSIS — G40909 Epilepsy, unspecified, not intractable, without status epilepticus: Secondary | ICD-10-CM

## 2012-09-30 LAB — CBC WITH DIFFERENTIAL/PLATELET
Basophils Relative: 1 % (ref 0–1)
Eosinophils Absolute: 0.3 10*3/uL (ref 0.0–0.7)
Eosinophils Relative: 3 % (ref 0–5)
Lymphs Abs: 2.6 10*3/uL (ref 0.7–4.0)
MCH: 28.7 pg (ref 26.0–34.0)
MCHC: 33.6 g/dL (ref 30.0–36.0)
MCV: 85.4 fL (ref 78.0–100.0)
Platelets: 474 10*3/uL — ABNORMAL HIGH (ref 150–400)
RBC: 4.78 MIL/uL (ref 3.87–5.11)

## 2012-09-30 LAB — URINALYSIS, ROUTINE W REFLEX MICROSCOPIC
Bilirubin Urine: NEGATIVE
Glucose, UA: NEGATIVE mg/dL
Hgb urine dipstick: NEGATIVE
Leukocytes, UA: NEGATIVE
pH: 5.5 (ref 5.0–8.0)

## 2012-09-30 NOTE — Progress Notes (Signed)
Subjective:    Patient ID: Danielle Horn, female    DOB: Dec 20, 1975, 37 y.o.   MRN: [id number]  HPI Had another seizure on June 5th. Woke up with a really sore tongue and blood on her pillow so knew she had a seizure. Not witnessed.  Husband wasn't there. She is on her topamax. Hasn't happened since then.     Review of Systems No joint pain. No change in HA.  Has migraines related to her cycle k but this is not new. No skin or hair changes. No recent difficulty with balance or gait. No change in speech or mentation or hearing or vision changes.      BP 130/80  Pulse 80  Ht 5\' 6"  (1.676 m)  Wt 209 lb (94.802 kg)  BMI 33.75 kg/m2    Allergies  Allergen Reactions  . Augmentin (Amoxicillin-Pot Clavulanate) Nausea And Vomiting and Rash    No past medical history on file.  Past Surgical History  Procedure Laterality Date  . Cesarean section      History   Social History  . Marital Status: Married    Spouse Name: N/A    Number of Children: N/A  . Years of Education: N/A   Occupational History  . Not on file.   Social History Main Topics  . Smoking status: Never Smoker   . Smokeless tobacco: Not on file  . Alcohol Use: No  . Drug Use: No  . Sexually Active:    Other Topics Concern  . Not on file   Social History Narrative  . No narrative on file    No family history on file.  Outpatient Encounter Prescriptions as of 09/30/2012  Medication Sig Dispense Refill  . levonorgestrel-ethinyl estradiol (SEASONALE,INTROVALE,JOLESSA) 0.15-0.03 MG tablet Take 1 tablet by mouth daily.      Marland Kitchen topiramate (TOPAMAX) 50 MG tablet Take 50 mg by mouth 2 (two) times daily.       No facility-administered encounter medications on file as of 09/30/2012.       Objective:   Physical Exam  Constitutional: She is oriented to person, place, and time. She appears well-developed and well-nourished.  HENT:  Head: Normocephalic and atraumatic.  Cardiovascular: Normal rate, regular rhythm  and normal heart sounds.   Pulmonary/Chest: Effort normal and breath sounds normal.  Neurological: She is alert and oriented to person, place, and time.  Alert and oriented.  CN 2-12 intact.  Neg rhomberg. Normal rapid alternating movements of hands. Reflexes symmetric in the UE and LE.  Normal knee to ankle, down the shin bilaterally.  No tremor.     Skin: Skin is warm and dry.  Psychiatric: She has a normal mood and affect. Her behavior is normal.          Assessment & Plan:  Seizure-I. did review the note from the neurologist, from a year ago. He had recommended her to continue the Topamax and to consider repeating her MRI if she had breakthrough seizures. At this point we will go ahead and repeat her MRI. Has been a year and we can see if the hippocampal abnormality is still there or if there have been any changes. He also recommended considering a workup for lupus. She has not had any joint pain or skin or hair changes but certainly with a prior history of elevated platelets and CRP she could have some type of autoimmune process going on. We will do some additional blood work today to evaluate for possible lupus. If her  levels are still elevated then refer to rheumatology for further evaluation. If there any changes on her MRI and we will refer her back to neurology. Continue Topamax for now.  Does have low iron on her labs from last year and I want  to repeat that.  Time spent 25 minutes presented to person the time spent counseling about her seizures.

## 2012-10-06 ENCOUNTER — Telehealth: Payer: Self-pay | Admitting: *Deleted

## 2012-10-06 LAB — ANTI-DNA ANTIBODY, DOUBLE-STRANDED: ds DNA Ab: 2 IU/mL (ref <=4)

## 2012-10-06 NOTE — Telephone Encounter (Signed)
Prior auth obtained for MRI brain w/w/o contrast through [employer]. Authorization # [id number]. Barry Dienes, LPN

## 2012-10-10 ENCOUNTER — Ambulatory Visit (HOSPITAL_BASED_OUTPATIENT_CLINIC_OR_DEPARTMENT_OTHER)
Admission: RE | Admit: 2012-10-10 | Discharge: 2012-10-10 | Disposition: A | Discharge status: Home / Self Care | Payer: BC Managed Care – PPO | Source: Ambulatory Visit | Attending: Family Medicine | Admitting: Family Medicine

## 2012-10-10 DIAGNOSIS — R569 Unspecified convulsions: Secondary | ICD-10-CM | POA: Insufficient documentation

## 2012-10-10 DIAGNOSIS — G43909 Migraine, unspecified, not intractable, without status migrainosus: Secondary | ICD-10-CM | POA: Insufficient documentation

## 2012-10-10 DIAGNOSIS — R9089 Other abnormal findings on diagnostic imaging of central nervous system: Secondary | ICD-10-CM

## 2012-10-10 DIAGNOSIS — R930 Abnormal findings on diagnostic imaging of skull and head, not elsewhere classified: Secondary | ICD-10-CM | POA: Insufficient documentation

## 2012-10-10 DIAGNOSIS — G40909 Epilepsy, unspecified, not intractable, without status epilepticus: Secondary | ICD-10-CM

## 2012-10-10 MED ORDER — GADOBENATE DIMEGLUMINE 529 MG/ML IV SOLN
19.0000 mL | Freq: Once | INTRAVENOUS | Status: AC | PRN
  Administered 2012-10-10: 19 mL via INTRAVENOUS

## 2012-10-13 ENCOUNTER — Other Ambulatory Visit: Payer: Self-pay | Admitting: Family Medicine

## 2012-10-13 DIAGNOSIS — J338 Other polyp of sinus: Secondary | ICD-10-CM

## 2012-12-07 ENCOUNTER — Telehealth: Payer: Self-pay | Admitting: *Deleted

## 2012-12-07 ENCOUNTER — Telehealth: Payer: Self-pay | Admitting: Neurology

## 2012-12-07 NOTE — Telephone Encounter (Signed)
Pt informed and I gave her the name of the neuro.  Meyer Cory, LPN

## 2012-12-07 NOTE — Telephone Encounter (Signed)
She cannot drive at [facility]!!!  She really needs to see Neuro to hav eher meds adjusted. We can check our chart to see if we have name of neuro on our files.
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
## 2012-12-16 ENCOUNTER — Telehealth: Payer: Self-pay | Admitting: Neurology

## 2012-12-16 MED ORDER — TOPIRAMATE 50 MG PO TABS: ORAL_TABLET | ORAL | Status: DC

## 2012-12-16 NOTE — Telephone Encounter (Signed)
Per Dr Anne Hahn' last phone note: The patient will go up on the Topamax taking 50 mg in the morning, 100 mg in the evening.  I have updated Rx and sent it to the pharmacy.

## 2012-12-23 ENCOUNTER — Encounter: Payer: Self-pay | Admitting: Neurology

## 2012-12-23 ENCOUNTER — Ambulatory Visit (INDEPENDENT_AMBULATORY_CARE_PROVIDER_SITE_OTHER): Payer: BC Managed Care – PPO | Admitting: Neurology

## 2012-12-23 VITALS — BP 134/85 | HR 74 | Wt 205.0 lb

## 2012-12-23 DIAGNOSIS — G43109 Migraine with aura, not intractable, without status migrainosus: Secondary | ICD-10-CM

## 2012-12-23 MED ORDER — TOPIRAMATE 50 MG PO TABS: ORAL_TABLET | ORAL | Status: DC
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
## 2013-01-04 DIAGNOSIS — Z0289 Encounter for other administrative examinations: Secondary | ICD-10-CM

## 2013-01-15 ENCOUNTER — Telehealth: Payer: Self-pay

## 2013-01-15 NOTE — Telephone Encounter (Signed)
I called patient to let her know I was ready to fax form but did not want to do it if she was not near fax. Patient states she has left work for the day and has requested that I send it Monday morning.

## 2013-05-03 ENCOUNTER — Telehealth: Payer: Self-pay | Admitting: Neurology

## 2013-05-03 NOTE — Telephone Encounter (Signed)
I Called the patient. The patient had a brief event of feeling "zoned out". This occurred around the time of a viral illness. This could potentially have been associated with a small seizure event, but the patient did not have loss of consciousness or jerking. If this recurs, I would increase the Topamax dosing. The patient will keep in touch.

## 2013-05-03 NOTE — Telephone Encounter (Signed)
Patient states that she thinks her episode on Thursday night was related to her getting the stomach bug on Friday.  Patient states her husband was talking to her but she lost her train of thought, it was a little more than normal and she did tell him to wait a minute and she came back.  It lasted about 20 seconds, she was loopy, feeling weird and she got sick on Friday with the stomach bug.  Patient is still taking the Topamax 47m in am and 100 mg and has not missed any doses.  Patient states this has not happened since Thursday and she does feel better.  Wanted to let Dr. WJannifer Franklinknow what happened to make sure it is not related to her seizure disorder.  FYI

## 2013-05-03 NOTE — Telephone Encounter (Signed)
RETURNING CALL--CALL AFTER CALL AFTER 1PM

## 2013-05-03 NOTE — Telephone Encounter (Signed)
Patient calling to state that she had an episode on Thursday night and she is unsure if it is related to her seizure disorder and wanted to speak with someone about it. Patient states there was a period of time where she lost her train of thought while she was talking to her husband. Please call patient and advise.

## 2013-06-15 ENCOUNTER — Telehealth: Payer: Self-pay | Admitting: Neurology

## 2013-06-15 MED ORDER — TOPIRAMATE 25 MG PO TABS: ORAL_TABLET | ORAL | Status: DC

## 2013-06-15 NOTE — Telephone Encounter (Signed)
The pharmacy is unable to get Tpx 40m at this time.  They asked that we resend the Rx for 280minstead.  Rx has been sent.  The daily dose remains the same.

## 2013-06-15 NOTE — Telephone Encounter (Signed)
Patient is needing refill for Topiramate 37m but Rx is on back order--needs to have new Rx called in for Topiramate 244mand change dosage[facility]

## 2013-06-23 ENCOUNTER — Encounter (INDEPENDENT_AMBULATORY_CARE_PROVIDER_SITE_OTHER): Payer: Self-pay

## 2013-06-23 ENCOUNTER — Ambulatory Visit (INDEPENDENT_AMBULATORY_CARE_PROVIDER_SITE_OTHER): Payer: BC Managed Care – PPO | Admitting: Nurse Practitioner

## 2013-06-23 ENCOUNTER — Encounter: Payer: Self-pay | Admitting: Nurse Practitioner

## 2013-06-23 VITALS — BP 134/89 | HR 79 | Ht 66.5 in | Wt 205.0 lb

## 2013-06-23 DIAGNOSIS — G43109 Migraine with aura, not intractable, without status migrainosus: Secondary | ICD-10-CM

## 2013-06-23 DIAGNOSIS — G40309 Generalized idiopathic epilepsy and epileptic syndromes, not intractable, without status epilepticus: Secondary | ICD-10-CM

## 2013-06-23 DIAGNOSIS — G40909 Epilepsy, unspecified, not intractable, without status epilepticus: Secondary | ICD-10-CM
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
## 2013-06-23 NOTE — Progress Notes (Signed)
I have read the note, and I agree with the clinical assessment and plan.  Makhya Arave KEITH   

## 2013-06-23 NOTE — Patient Instructions (Signed)
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

## 2013-10-09 ENCOUNTER — Other Ambulatory Visit: Payer: Self-pay | Admitting: Neurology

## 2013-11-01 ENCOUNTER — Telehealth: Payer: Self-pay | Admitting: Nurse Practitioner

## 2013-11-02 NOTE — Telephone Encounter (Signed)
Called patient on mobile unable to leave message, called home number left message that best to read precautions on the labels, ask pharmacist if unsure, try to stay away from products with DM in the label.

## 2013-11-30 ENCOUNTER — Telehealth: Payer: Self-pay | Admitting: Neurology

## 2013-11-30 NOTE — Telephone Encounter (Signed)
I called patient. I do not think that there is any major contraindication to using Lexapro. I discussed this with her.

## 2013-11-30 NOTE — Telephone Encounter (Signed)
Patient was prescribed Lexapro 10 mg qd by Mechele Claude Sheets. They want to know if its ok to take with seizure medication? Please consult.

## 2013-11-30 NOTE — Telephone Encounter (Signed)
Patient calling to check if Lexapro is okay for her to take, please call and advise.

## 2013-12-28 ENCOUNTER — Encounter: Payer: Self-pay | Admitting: Nurse Practitioner

## 2013-12-28 ENCOUNTER — Ambulatory Visit (INDEPENDENT_AMBULATORY_CARE_PROVIDER_SITE_OTHER): Payer: BC Managed Care – PPO | Admitting: Nurse Practitioner

## 2013-12-28 VITALS — BP 118/85 | HR 64 | Ht 66.0 in | Wt 209.8 lb

## 2013-12-28 DIAGNOSIS — G40909 Epilepsy, unspecified, not intractable, without status epilepticus: Secondary | ICD-10-CM

## 2013-12-28 DIAGNOSIS — G40309 Generalized idiopathic epilepsy and epileptic syndromes, not intractable, without status epilepticus: Secondary | ICD-10-CM

## 2013-12-28 DIAGNOSIS — G43109 Migraine with aura, not intractable, without status migrainosus: Secondary | ICD-10-CM

## 2013-12-28 MED ORDER — TOPIRAMATE 50 MG PO TABS: 50.0000 mg | ORAL_TABLET | Freq: Two times a day (BID) | ORAL | Status: DC

## 2013-12-28 NOTE — Progress Notes (Signed)
[facility]  PATIENT: Danielle Horn DOB: 1975/11/25   REASON FOR VISIT: Followup for seizure disorder and migraine  HISTORY OF PRESENT ILLNESS:Danielle Horn, 38 year old female returns for followup. She was last seen in the office 06/23/13. She has had seizures when missing doses of her Topamax in the past,  her last seizure occurred on 12/06/2012. Her headaches are in good control. She is back to driving.  She has been compliant with her medication. She has no new neurologic complaints She returns for reevaluation   HISTORY: of nocturnal seizures. The patient has been Topamax taking 50 mg twice daily. The patient indicates that in June of 2014, she had a seizure at night while sleeping. The medications apparently were not readjusted at that time. A repeat MRI scan of the brain was done, showing no change from the prior study that revealed slight increased signal in the left amygdala. 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 On 12/06/12, the patient had missed 4 doses of her Topamax, and was driving back home and she had a seizure. The patient's husband was in the car the time, and he was able to get control of the motor vehicle, and get the car to the side of the road. The patient does not recall any events until approximately 20 or 30 minutes later. EMS was called, and the patient was taken to the hospital. The patient has had an increase in the dosing of the Topamax since that time, taking 50 mg in the morning, and 100 mg in the evening. The patient returns to this office for further evaluation.    REVIEW OF SYSTEMS: Full 14 system review of systems performed and notable only for those listed, all others are neg:  Constitutional: N/A  Cardiovascular: N/A  Ear/Nose/Throat: N/A  Skin: N/A  Eyes: N/A  Respiratory: N/A  Gastroitestinal: N/A  Hematology/Lymphatic: N/A  Endocrine: N/A Musculoskeletal:N/A  Allergy/Immunology:  Seasonal allergies  Neurological: Seizure disorder Psychiatric: Depression Sleep : NA   ALLERGIES: No Known Allergies  HOME MEDICATIONS: Outpatient Prescriptions Prior to Visit  Medication Sig Dispense Refill  . levonorgestrel-ethinyl estradiol (SEASONALE,INTROVALE,JOLESSA) 0.15-0.03 MG tablet Take 1 tablet by mouth daily.      Marland Kitchen topiramate (TOPAMAX) 25 MG tablet TAKE TWO TABLETS BY MOUTH IN THE MORNING AND FOUR TABLETS IN THE EVENING.  180 tablet  3   No facility-administered medications prior to visit.    PAST MEDICAL HISTORY: Past Medical History  Diagnosis Date  . Seizures   . Migraine headache   . Obesity   . Deficiency anemia   . History of postpartum depression     PAST SURGICAL HISTORY: Past Surgical History  Procedure Laterality Date  . Cesarean section    . Wisdom tooth extraction      FAMILY HISTORY: Family History  Problem Relation Age of Onset  . Dementia Maternal Grandmother     SOCIAL HISTORY: History   Social History  . Marital Status: Married    Spouse Name: N/A    Number of Children: 2  . Years of Education: college   Occupational History  . Teacher     Social History Main Topics  . Smoking status: Never Smoker   . Smokeless tobacco: Never Used  . Alcohol Use: No  . Drug Use: No  . Sexual Activity: Not on file   Other Topics Concern  . Not on file   Social History Narrative   Patient lives at  home with husband Truddie Crumble.    Patient has 2 children.    Patient is a Pharmacist, hospital.    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   Patient is right handed.      PHYSICAL EXAM  Filed Vitals:   12/28/13 0923  BP: 118/85  Pulse: 64  Height: 5' 6"  (1.676 m)  Weight: 209 lb 12.8 oz (95.165 kg)   Body mass index is 33.88 kg/(m^2). Generalized: Well developed, obese female in no acute distress  Head: normocephalic and atraumatic,. Oropharynx benign  Neck: Supple, no carotid bruits  Musculoskeletal: No deformity  Neurological examination  Mentation:  Alert oriented to time, place, history taking. Follows all commands speech and language fluent  Cranial nerve II-XII: Pupils were equal round reactive to light extraocular movements were full, visual field were full on confrontational test. Facial sensation and strength were normal. hearing was intact to finger rubbing bilaterally. Uvula tongue midline. head turning and shoulder shrug were normal and symmetric.Tongue protrusion into cheek strength was normal.  Motor: normal bulk and tone, full strength in the BUE, BLE, fine finger movements normal, no pronator drift. No focal weakness  Sensory: normal and symmetric to light touch, pinprick, and vibration  Coordination: finger-nose-finger, heel-to-shin bilaterally, no dysmetria  Reflexes: Brachioradialis 2/2, biceps 2/2, triceps 2/2, patellar 2/2, Achilles 2/2, plantar responses were flexor bilaterally.  Gait and Station: Rising up from seated position without assistance, normal stance, moderate stride, good arm swing, smooth turning, able to perform tiptoe, and heel walking without difficulty. Tandem gait is steady     DIAGNOSTIC DATA (LABS, IMAGING, TESTING) - I reviewed patient records, labs, notes, testing and imaging myself where available.  Lab Results  Component Value Date   WBC 8.4 09/30/2012   HGB 13.7 09/30/2012   HCT 40.8 09/30/2012   MCV 85.4 09/30/2012   PLT 474* 09/30/2012      Component Value Date/Time   NA 140 09/09/2011 1333   K 5.0 09/09/2011 1333   CL 106 09/09/2011 1333   CO2 25 09/09/2011 1333   GLUCOSE 86 09/09/2011 1333   BUN 12 09/09/2011 1333   CREATININE 0.82 09/09/2011 1333   CREATININE 0.81 10/17/2009 2058   CALCIUM 9.3 09/09/2011 1333   PROT 7.3 09/09/2011 1333   ALBUMIN 4.1 09/09/2011 1333   AST 19 09/09/2011 1333   ALT 22 09/09/2011 1333   ALKPHOS 56 09/09/2011 1333   BILITOT 1.2 09/09/2011 1333   GFRNONAA >89 09/09/2011 1333   GFRAA >89 09/09/2011 1333    Lab Results  Component Value Date   TSH 1.535 09/09/2011       ASSESSMENT AND PLAN  38 y.o. year old female  has a past medical history of Seizures; Migraine headache; here to follow up.  Continue Topamax 50 mg twice daily will renew Call for any seizure activity Followup in 6 months Dennie Bible, [facility], [facility], [facility] [address] [place], [place] [address] [phone]

## 2013-12-28 NOTE — Patient Instructions (Signed)
Continue Topamax 50 mg twice daily will renew Call for any seizure activity Followup in 6 months

## 2013-12-28 NOTE — Progress Notes (Signed)
I have read the note, and I agree with the clinical assessment and plan.  WILLIS,CHARLES KEITH   

## 2013-12-30 ENCOUNTER — Telehealth: Payer: Self-pay | Admitting: *Deleted

## 2013-12-30 MED ORDER — TOPIRAMATE 50 MG PO TABS: ORAL_TABLET | ORAL | Status: DC

## 2013-12-30 NOTE — Telephone Encounter (Signed)
Patient states that Topamax Rx was sent in wrong, normally takes 150 mg in the morning and 250 mg at night, patient states the Rx was written as she only takes 150 mg bid

## 2013-12-30 NOTE — Telephone Encounter (Signed)
I called [facility] to verify the Rx they have been dispensing.  Spoke with Danae Chen.  She said the patient has been getting Rx for 58m in am and 1053mhs.  I called the patient back at home, got no answer.  Called cell, got no answer, VM has not been set up yet.  Unable to leave message.

## 2013-12-30 NOTE — Telephone Encounter (Signed)
Patient called back.  She verified she has been taking 52m in am and 1015mat night, nothing more.  Says there was some confusion with [facility] because they were out of stock on the 5021mreviously, but now they have it available again.  I called the pharmacy again.  [facility]59m [facility] and will be filling Rx as prescribed for 50 in am and 100 in pm.

## 2013-12-30 NOTE — Telephone Encounter (Signed)
I dont see a dose change she is been on 27m in the am and 1087mat hs for a while

## 2014-01-20 ENCOUNTER — Emergency Department (INDEPENDENT_AMBULATORY_CARE_PROVIDER_SITE_OTHER)
Admission: EM | Admit: 2014-01-20 | Discharge: 2014-01-20 | Disposition: A | Discharge status: Home / Self Care | Payer: BC Managed Care – PPO | Source: Home / Self Care | Attending: Emergency Medicine | Admitting: Emergency Medicine

## 2014-01-20 ENCOUNTER — Emergency Department (INDEPENDENT_AMBULATORY_CARE_PROVIDER_SITE_OTHER): Payer: BC Managed Care – PPO

## 2014-01-20 ENCOUNTER — Encounter: Payer: Self-pay | Admitting: Emergency Medicine

## 2014-01-20 ENCOUNTER — Telehealth: Payer: Self-pay | Admitting: Emergency Medicine

## 2014-01-20 DIAGNOSIS — N1330 Unspecified hydronephrosis: Secondary | ICD-10-CM

## 2014-01-20 DIAGNOSIS — N201 Calculus of ureter: Secondary | ICD-10-CM

## 2014-01-20 DIAGNOSIS — R109 Unspecified abdominal pain: Secondary | ICD-10-CM

## 2014-01-20 DIAGNOSIS — N200 Calculus of kidney: Secondary | ICD-10-CM

## 2014-01-20 LAB — POCT URINALYSIS DIP (MANUAL ENTRY)
GLUCOSE UA: NEGATIVE
Leukocytes, UA: NEGATIVE
NITRITE UA: NEGATIVE
PH UA: 5.5 (ref 5–8)
Protein Ur, POC: 100
SPEC GRAV UA: 1.025 (ref 1.005–1.03)
Urobilinogen, UA: 1 (ref 0–1)

## 2014-01-20 MED ORDER — ONDANSETRON 4 MG PO TBDP: 4.0000 mg | ORAL_TABLET | Freq: Three times a day (TID) | ORAL | Status: AC | PRN

## 2014-01-20 MED ORDER — TAMSULOSIN HCL 0.4 MG PO CAPS: 0.4000 mg | ORAL_CAPSULE | Freq: Every day | ORAL | Status: AC

## 2014-01-20 MED ORDER — KETOROLAC TROMETHAMINE 60 MG/2ML IM SOLN
60.0000 mg | Freq: Once | INTRAMUSCULAR | Status: AC
  Administered 2014-01-20: 60 mg via INTRAMUSCULAR

## 2014-01-20 MED ORDER — OXYCODONE-ACETAMINOPHEN 5-325 MG PO TABS: 2.0000 | ORAL_TABLET | ORAL | Status: AC | PRN

## 2014-01-20 NOTE — ED Notes (Signed)
Yesterday left flank pain and vomiting, today vomiting has resolved, pain is better from 8 to 3, feeling nauseated

## 2014-01-20 NOTE — Discharge Instructions (Signed)

## 2014-01-20 NOTE — ED Provider Notes (Signed)
CSN: [id number]     Arrival date & time 01/20/14  0845 History   First MD Initiated Contact with Patient 01/20/14 0920     Chief Complaint  Patient presents with  . Emesis   (Consider location/radiation/quality/duration/timing/severity/associated sxs/prior Treatment) Patient is a 38 y.o. female presenting with abdominal pain. The history is provided by the patient. No language interpreter was used.  Abdominal Pain This is a new problem. The problem occurs constantly. The problem has been gradually worsening. Associated symptoms include abdominal pain. Nothing aggravates the symptoms. Nothing relieves the symptoms. She has tried nothing for the symptoms. The treatment provided no relief.  Pt complains of left side pain.    Past Medical History  Diagnosis Date  . Seizures   . Migraine headache   . Obesity   . Deficiency anemia   . History of postpartum depression    Past Surgical History  Procedure Laterality Date  . Cesarean section    . Wisdom tooth extraction     Family History  Problem Relation Age of Onset  . Dementia Maternal Grandmother    History  Substance Use Topics  . Smoking status: Never Smoker   . Smokeless tobacco: Never Used  . Alcohol Use: No   OB History   Grav Para Term Preterm Abortions TAB SAB Ect Mult Living                 Review of Systems  Gastrointestinal: Positive for nausea and abdominal pain.  All other systems reviewed and are negative.   Allergies  Review of patient's allergies indicates no active allergies.  Home Medications   Prior to Admission medications   Medication Sig Start Date End Date Taking? Authorizing Provider  escitalopram (LEXAPRO) 10 MG tablet Take 10 mg by mouth daily.    Historical Provider, MD  levonorgestrel-ethinyl estradiol (SEASONALE,INTROVALE,JOLESSA) 0.15-0.03 MG tablet Take 1 tablet by mouth daily.    Historical Provider, MD  topiramate (TOPAMAX) 50 MG tablet 1m in the morning and 1054mat night. 12/30/13    NaDennie BibleNP   BP 118/85  Pulse 72  Temp(Src) 97.9 F (36.6 C) (Oral)  Ht 5' 6"  (1.676 m)  Wt 207 lb (93.895 kg)  BMI 33.43 kg/m2  SpO2 100%  LMP 01/12/2014 Physical Exam  Nursing note and vitals reviewed. Constitutional: She appears well-developed and well-nourished.  HENT:  Head: Normocephalic and atraumatic.  Eyes: Conjunctivae and EOM are normal. Pupils are equal, round, and reactive to light.  Neck: Normal range of motion.  Cardiovascular: Normal rate.   Pulmonary/Chest: Effort normal.  Abdominal: Soft. She exhibits no mass. There is no rebound and no guarding.  Musculoskeletal: Normal range of motion.  Neurological: She is alert.  Skin: Skin is warm.    ED Course  Procedures (including critical care time) Labs Review Labs Reviewed  POCT URINALYSIS DIP (MANUAL ENTRY)  urine moderate blood  Imaging Review Ct Abdomen Pelvis Wo Contrast  01/20/2014   CLINICAL DATA:  Acute left flank pain.  EXAM: CT ABDOMEN AND PELVIS WITHOUT CONTRAST  TECHNIQUE: Multidetector CT imaging of the abdomen and pelvis was performed following the standard protocol without IV contrast.  COMPARISON:  None.  FINDINGS: No significant abnormality is noted in the visualized lung bases. No significant osseous abnormality is noted.  No gallstones are noted. Probable small right hepatic cyst is noted. No other focal abnormality is noted in the liver, spleen or pancreas on these unenhanced images. Adrenal glands appear normal. No renal calculi are  noted. Mild left hydroureteronephrosis is noted secondary to 6 mm calculus in the distal left ureter just above the ureterovesical junction. Uterus appears normal. Urinary bladder is decompressed the appendix appears normal. There is no evidence of bowel obstruction. No abnormal fluid collection is noted. No significant adenopathy is noted.  IMPRESSION: Mild left hydroureteronephrosis is noted secondary to 6 mm distal left ureteral calculus.    Electronically Signed   By: Sabino Dick M.D.   On: 01/20/2014 10:21     MDM  Pt counseled on kidney stone and follow up.   Pt given rx for torodol   1. Flank pain   2. Kidney stone    Percocet  Zofran flomax Follow up with [facility]   Fransico Meadow, PA-C 01/20/14 1059

## 2014-01-21 NOTE — ED Provider Notes (Signed)
Medical history/examination/treatment/procedure(s) were performed by non-physician provider and as supervising physician I was immediately available for consultation/collaboration.  Jacqulyn Cane, MD 01/21/14 1249

## 2014-02-08 DIAGNOSIS — N200 Calculus of kidney: Secondary | ICD-10-CM | POA: Insufficient documentation

## 2014-06-28 ENCOUNTER — Encounter: Payer: Self-pay | Admitting: Nurse Practitioner

## 2014-06-28 ENCOUNTER — Ambulatory Visit (INDEPENDENT_AMBULATORY_CARE_PROVIDER_SITE_OTHER): Payer: BC Managed Care – PPO | Admitting: Nurse Practitioner

## 2014-06-28 VITALS — BP 109/78 | HR 72 | Ht 66.0 in | Wt 220.4 lb

## 2014-06-28 DIAGNOSIS — G43109 Migraine with aura, not intractable, without status migrainosus: Secondary | ICD-10-CM

## 2014-06-28 DIAGNOSIS — G40309 Generalized idiopathic epilepsy and epileptic syndromes, not intractable, without status epilepticus: Secondary | ICD-10-CM | POA: Diagnosis not present

## 2014-06-28 MED ORDER — TOPIRAMATE 50 MG PO TABS: ORAL_TABLET | ORAL | Status: AC

## 2014-06-28 NOTE — Patient Instructions (Signed)
Continue Topamax 70m am and 1055mhs Call for any seizure activity F/U yearly and prn

## 2014-06-28 NOTE — Progress Notes (Signed)
[facility]  PATIENT: Tanicia Wolaver DOB: [birth year]   REASON FOR VISIT: Follow-up for seizure disorder history of migraines HISTORY FROM: Patient    HISTORY OF PRESENT ILLNESS:Ms. Berryhill, 39 year old female returns for followup. She was last seen in the office 12/28/13. She has a history of generalized seizure disorder and her last seizure occurred when missing doses of her Topamax,, her last seizure occurred on 12/06/2012. She also has a history of migraine headaches which  are in good control.  She has been compliant with her medication. She has no new neurologic complaints She returns for reevaluation   HISTORY: of nocturnal seizures. The patient has been Topamax taking 50 mg twice daily. The patient indicates that in June of 2014, she had a seizure at night while sleeping. The medications apparently were not readjusted at that time. A repeat MRI scan of the brain was done, showing no change from the prior study that revealed slight increased signal in the left amygdala. 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 On 12/06/12, the patient had missed 4 doses of her Topamax, and was driving back home and she had a seizure. The patient's husband was in the car the time, and he was able to get control of the motor vehicle, and get the car to the side of the road. The patient does not recall any events until approximately 20 or 30 minutes later. EMS was called, and the patient was taken to the hospital. The patient has had an increase in the dosing of the Topamax since that time, taking 50 mg in the morning, and 100 mg in the evening. The patient returns to this office for further evaluation.     REVIEW OF SYSTEMS: Full 14 system review of systems performed and notable only for those listed, all others are neg:  Constitutional: neg  Cardiovascular: neg Ear/Nose/Throat: neg  Skin: neg Eyes: neg Respiratory:  neg Gastroitestinal: neg  Hematology/Lymphatic: neg  Endocrine: neg Musculoskeletal:neg Allergy/Immunology: neg Neurological: neg Psychiatric: neg Sleep : neg   ALLERGIES: No Known Allergies  HOME MEDICATIONS: Outpatient Prescriptions Prior to Visit  Medication Sig Dispense Refill  . levonorgestrel-ethinyl estradiol (SEASONALE,INTROVALE,JOLESSA) 0.15-0.03 MG tablet Take 1 tablet by mouth daily.    Marland Kitchen topiramate (TOPAMAX) 50 MG tablet 56m in the morning and 1065mat night. 270 tablet 1  . ondansetron (ZOFRAN ODT) 4 MG disintegrating tablet Take 1 tablet (4 mg total) by mouth every 8 (eight) hours as needed for nausea or vomiting. (Patient not taking: Reported on 06/28/2014) 20 tablet 0  . oxyCODONE-acetaminophen (PERCOCET/ROXICET) 5-325 MG per tablet Take 2 tablets by mouth every 4 (four) hours as needed for severe pain. (Patient not taking: Reported on 06/28/2014) 20 tablet 0  . tamsulosin (FLOMAX) 0.4 MG CAPS capsule Take 1 capsule (0.4 mg total) by mouth daily. (Patient not taking: Reported on 06/28/2014) 14 capsule 0  . escitalopram (LEXAPRO) 10 MG tablet Take 10 mg by mouth daily.     No facility-administered medications prior to visit.    PAST MEDICAL HISTORY: Past Medical History  Diagnosis Date  . Seizures   . Migraine headache   . Obesity   . Deficiency anemia   . History of postpartum depression     PAST SURGICAL HISTORY: Past Surgical History  Procedure Laterality Date  . Cesarean section    . Wisdom tooth extraction      FAMILY HISTORY: Family History  Problem Relation Age of Onset  .  Dementia Maternal Grandmother   . Anxiety disorder Son     SOCIAL HISTORY: History   Social History  . Marital Status: Married    Spouse Name: N/A  . Number of Children: 2  . Years of Education: college   Occupational History  . Teacher     Social History Main Topics  . Smoking status: Never Smoker   . Smokeless tobacco: Never Used  . Alcohol Use: No  . Drug  Use: No  . Sexual Activity: Not on file   Other Topics Concern  . Not on file   Social History Narrative   Patient lives at home with husband Truddie Crumble.    Patient has 2 children.    Patient is a Pharmacist, hospital.    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   Patient is right handed.      PHYSICAL EXAM  Filed Vitals:   06/28/14 0909  BP: 109/78  Pulse: 72  Height: 5' 6"  (1.676 m)  Weight: 220 lb 6.4 oz (99.973 kg)   Body mass index is 35.59 kg/(m^2). Generalized: Well developed, obese female in no acute distress  Head: normocephalic and atraumatic,. Oropharynx benign  Neck: Supple, no carotid bruits  Musculoskeletal: No deformity  Neurological examination  Mentation: Alert oriented to time, place, history taking. Follows all commands speech and language fluent  Cranial nerve II-XII: Pupils were equal round reactive to light extraocular movements were full, visual field were full on confrontational test. Facial sensation and strength were normal. hearing was intact to finger rubbing bilaterally. Uvula tongue midline. head turning and shoulder shrug were normal and symmetric.Tongue protrusion into cheek strength was normal.  Motor: normal bulk and tone, full strength in the BUE, BLE, fine finger movements normal, no pronator drift. No focal weakness  Sensory: normal and symmetric to light touch, pinprick, and vibration  Coordination: finger-nose-finger, heel-to-shin bilaterally, no dysmetria  Reflexes: Brachioradialis 2/2, biceps 2/2, triceps 2/2, patellar 2/2, Achilles 2/2, plantar responses were flexor bilaterally.  Gait and Station: Rising up from seated position without assistance, normal stance, moderate stride, good arm swing, smooth turning, able to perform tiptoe, and heel walking without difficulty. Tandem gait is steady      DIAGNOSTIC DATA (LABS, IMAGING, TESTING) -    ASSESSMENT AND PLAN  39 y.o. year old female  has a past medical history of Seizures; Migraine  headache; Obesity; Deficiency anemia;  here to follow up. Both of her seizure disorder and her migraine headaches are in good control with Topamax. She had recent labs at her primary care Dr. [place].  Continue Topamax 101m am and 1064mhs Patient to fax recent labs to my attention Call for any seizure activity Call for increase in headaches or change in pattern F/U yearly and prn NaDennie BibleGNWisconsin Surgery Center LLCBCNorth Colorado Medical CenterAPRN  [facility] [address]SuCircle D-KC EstatesrAllenhurstNC [phone]

## 2014-06-28 NOTE — Progress Notes (Signed)
I have read the note, and I agree with the clinical assessment and plan.  Danielle Horn KEITH   

## 2015-01-17 IMAGING — CT CT ABD-PELV W/O CM
3 of 4 series · 14 of 32 positions shown, 19 images · non-contrast
Comparison: None.

CLINICAL DATA: Acute left flank pain.

EXAM:
CT ABDOMEN AND PELVIS WITHOUT CONTRAST
TECHNIQUE: Multidetector CT imaging of the abdomen and pelvis was performed
following the standard protocol without IV contrast.

[Series 2: renal stone w/o · axial · non-contrast · 0.70mm/px · z∈[-329,-94]mm · 4 of 77 slices shown, 9 images]
[im 16/77  soft-tissue]
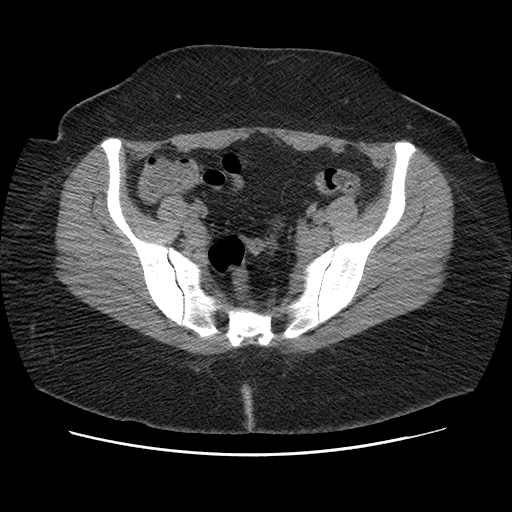
[im 16/77  lung]
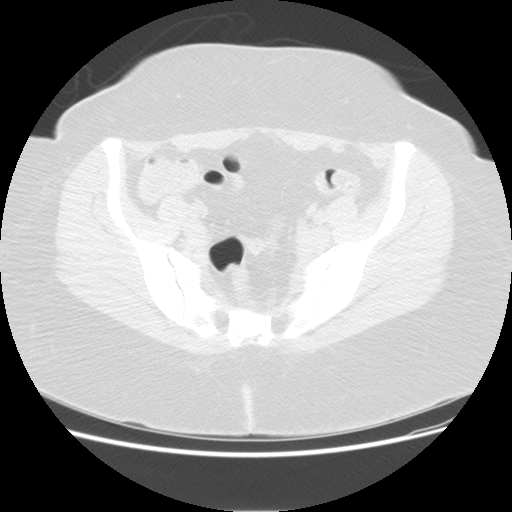
[im 16/77  bone]
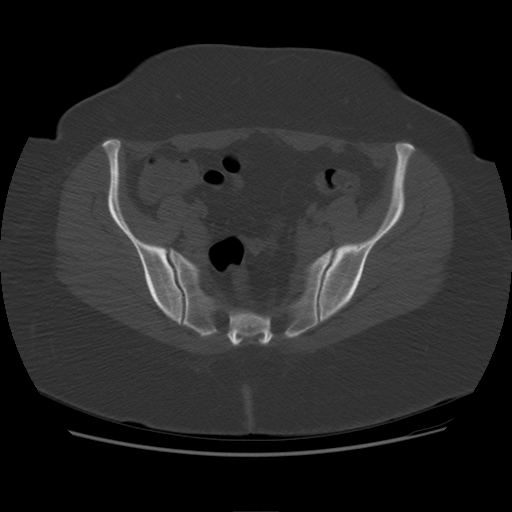
[im 31/77  soft-tissue]
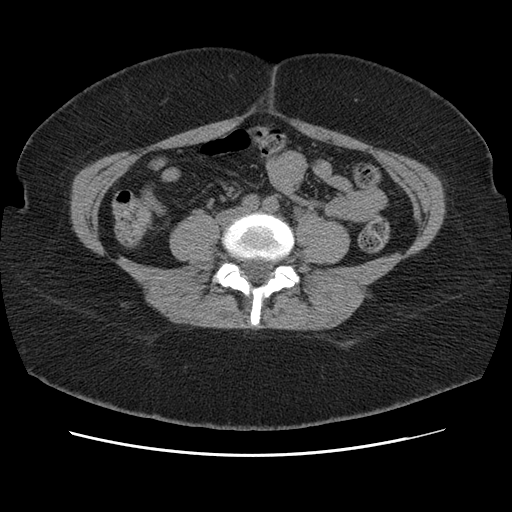
[im 31/77  lung]
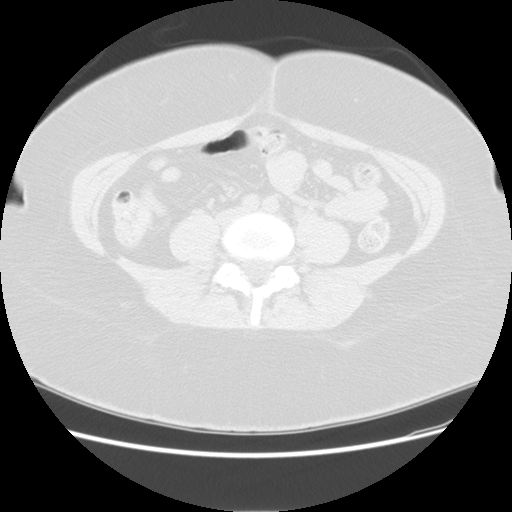
[im 46/77  soft-tissue]
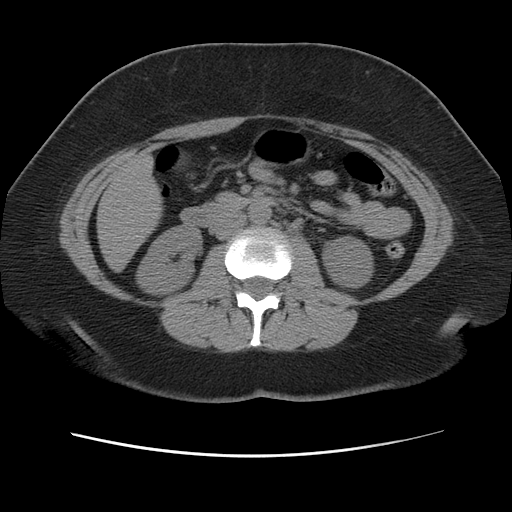
[im 46/77  lung]
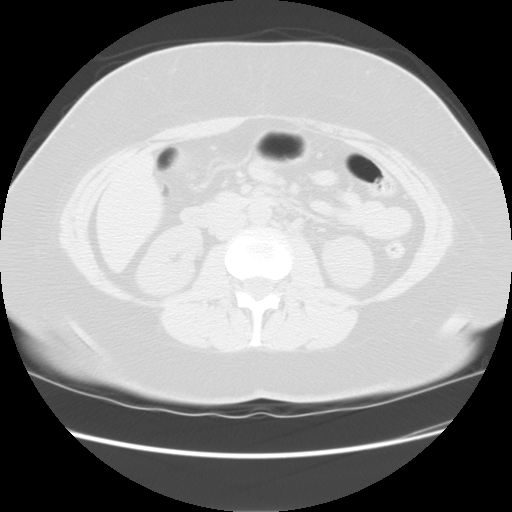
[im 61/77  soft-tissue]
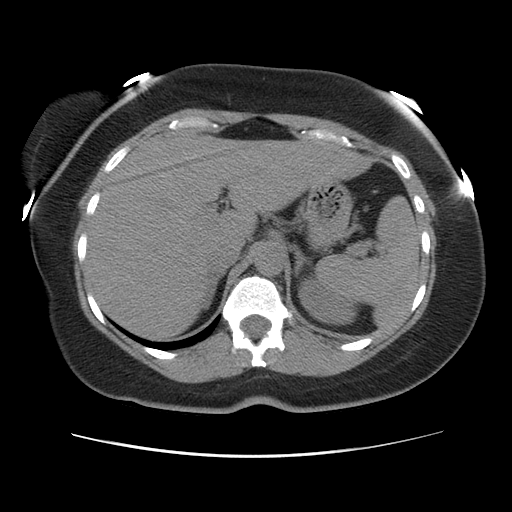
[im 61/77  lung]
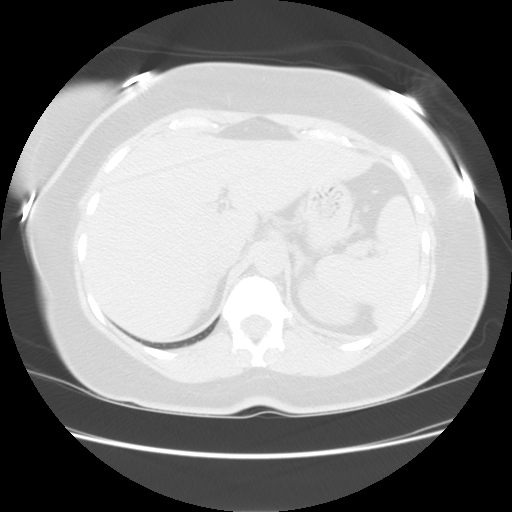

[Series 400: sag · sagittal · 0.91mm/px · 8 of 179 slices shown]
[im 17/179  soft-tissue]
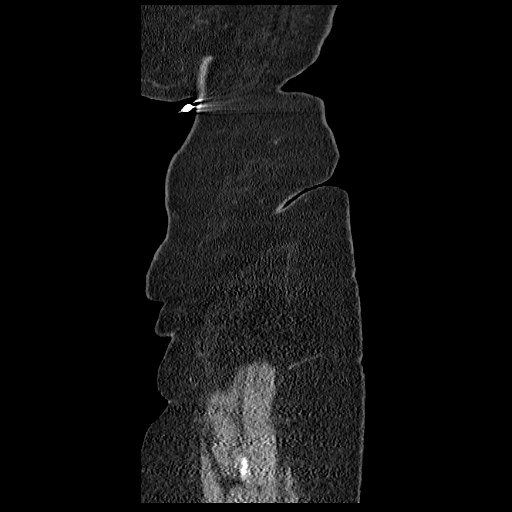
[im 33/179  soft-tissue]
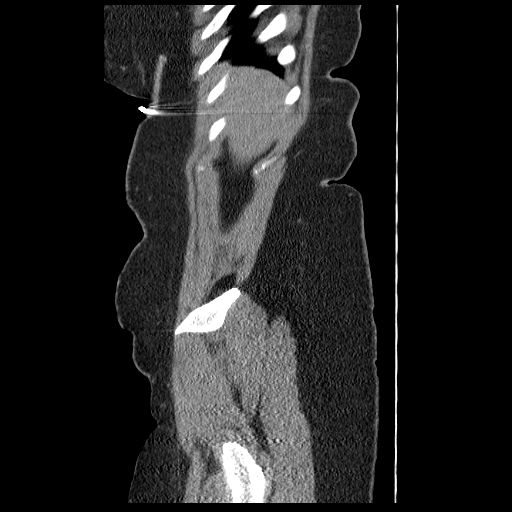
[im 65/179  soft-tissue]
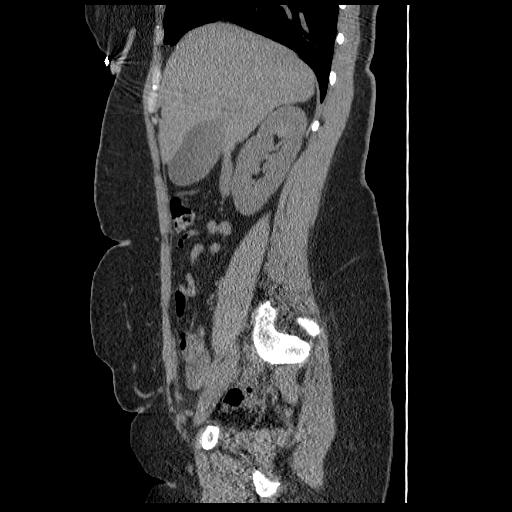
[im 81/179  soft-tissue]
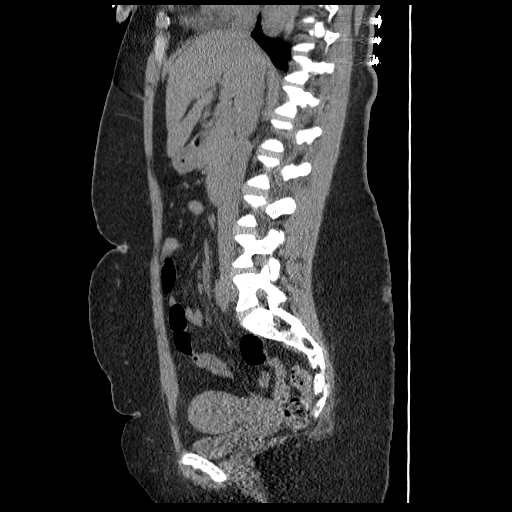
[im 98/179  soft-tissue]
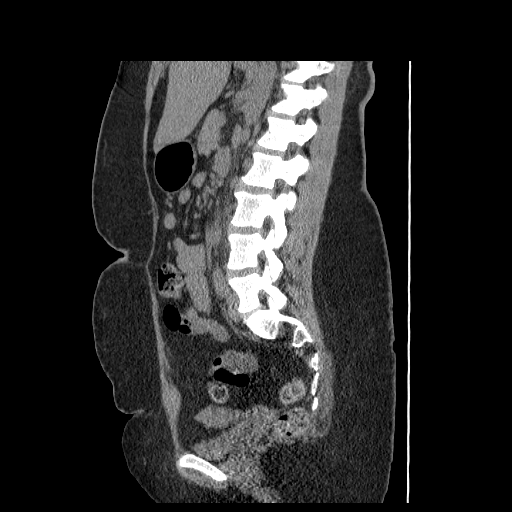
[im 114/179  soft-tissue]
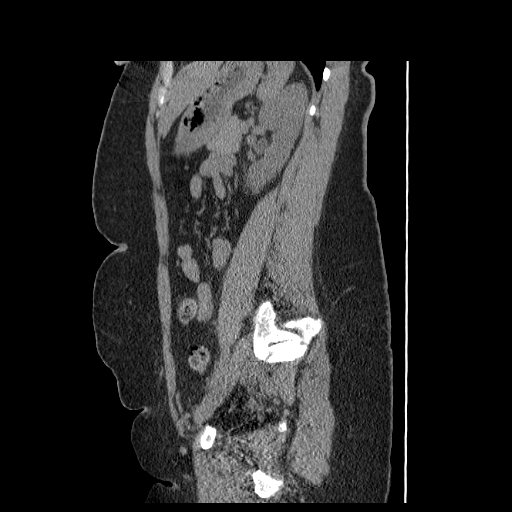
[im 146/179  soft-tissue]
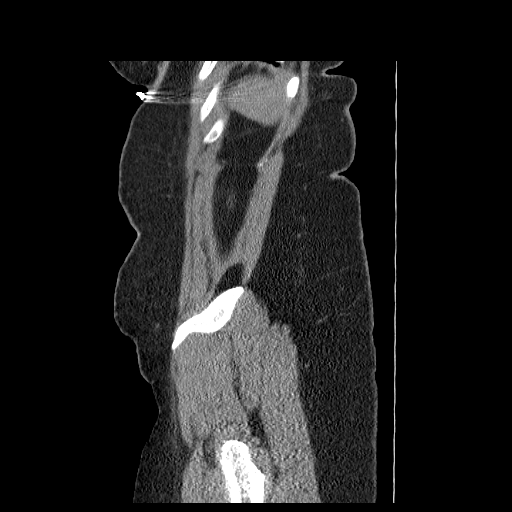
[im 162/179  soft-tissue]
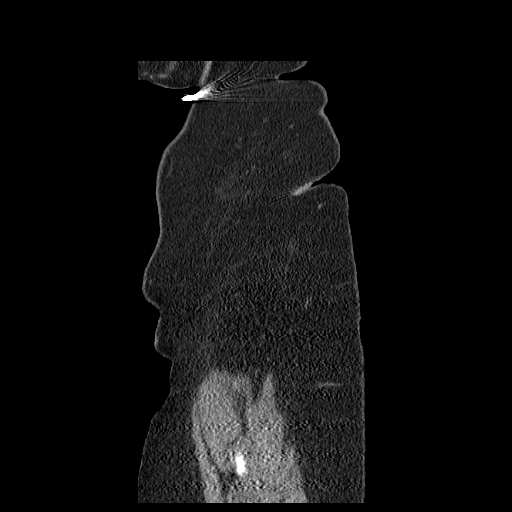

[Series 401: cor · coronal · 0.91mm/px · 2 of 179 slices shown]
[im 17/179  soft-tissue]
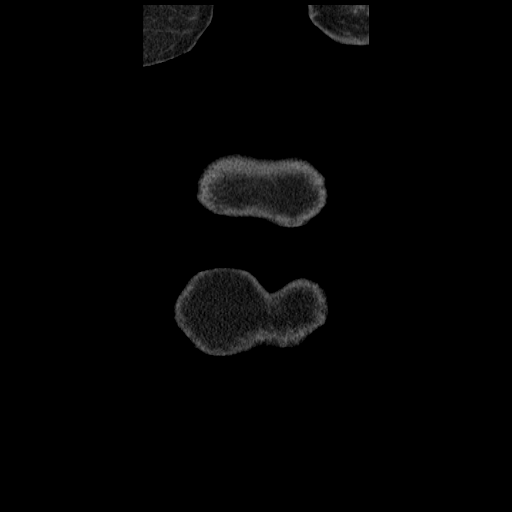
[im 33/179  soft-tissue]
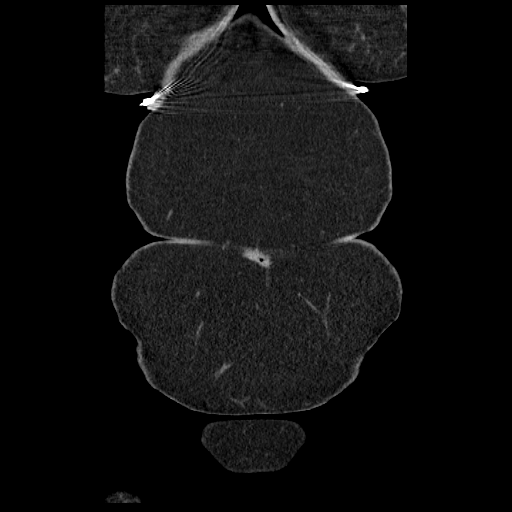

[14 of 32 positions shown; findings below may reference images not displayed]

FINDINGS: No significant abnormality is noted in the visualized lung bases. No
significant osseous abnormality is noted.

No gallstones are noted. Probable small right hepatic cyst is noted.
No other focal abnormality is noted in the liver, spleen or pancreas
on these unenhanced images. Adrenal glands appear normal. No renal
calculi are noted. Mild left hydroureteronephrosis is noted
secondary to 6 mm calculus in the distal left ureter just above the
ureterovesical junction. Uterus appears normal. Urinary bladder is
decompressed the appendix appears normal. There is no evidence of
bowel obstruction. No abnormal fluid collection is noted. No
significant adenopathy is noted.
IMPRESSION: Mild left hydroureteronephrosis is noted secondary to 6 mm distal
left ureteral calculus.

## 2015-06-01 ENCOUNTER — Ambulatory Visit: Payer: BC Managed Care – PPO | Admitting: Nurse Practitioner

## 2015-06-02 ENCOUNTER — Encounter: Payer: Self-pay | Admitting: Nurse Practitioner

## 2015-06-15 ENCOUNTER — Ambulatory Visit: Payer: BC Managed Care – PPO | Admitting: Nurse Practitioner

## 2015-06-27 ENCOUNTER — Ambulatory Visit: Payer: BC Managed Care – PPO | Admitting: Nurse Practitioner

## 2015-09-02 ENCOUNTER — Other Ambulatory Visit: Payer: Self-pay | Admitting: Nurse Practitioner
